# Patient Record
Sex: Female | Born: 1965 | Race: White | Hispanic: No | Marital: Single
Health system: Midwestern US, Community
[De-identification: ages and names within clinical notes are randomized; demographics above are authoritative.]

## PROBLEM LIST (undated history)

## (undated) DIAGNOSIS — M2550 Pain in unspecified joint: Secondary | ICD-10-CM

## (undated) DIAGNOSIS — F439 Reaction to severe stress, unspecified: Secondary | ICD-10-CM

## (undated) DIAGNOSIS — Z1231 Encounter for screening mammogram for malignant neoplasm of breast: Secondary | ICD-10-CM

---

## 2018-05-16 ENCOUNTER — Inpatient Hospital Stay: Admit: 2018-05-16 | Discharge: 2018-05-16 | Attending: Student in an Organized Health Care Education/Training Program

## 2018-05-16 NOTE — ED Notes (Signed)
Pt registered and left without being triaged.

## 2018-05-16 NOTE — ED Notes (Signed)
Pt registered and left without being triaged.

## 2018-06-06 ENCOUNTER — Ambulatory Visit: Attending: Internal Medicine | Primary: Internal Medicine

## 2018-06-06 ENCOUNTER — Inpatient Hospital Stay: Admit: 2018-06-07 | Payer: PRIVATE HEALTH INSURANCE | Primary: Internal Medicine

## 2018-06-06 ENCOUNTER — Inpatient Hospital Stay: Admit: 2018-06-06 | Payer: PRIVATE HEALTH INSURANCE | Attending: Internal Medicine | Primary: Internal Medicine

## 2018-06-06 ENCOUNTER — Ambulatory Visit
Admit: 2018-06-06 | Discharge: 2018-06-06 | Payer: PRIVATE HEALTH INSURANCE | Attending: Internal Medicine | Primary: Internal Medicine

## 2018-06-06 ENCOUNTER — Inpatient Hospital Stay: Admit: 2018-06-06 | Payer: PRIVATE HEALTH INSURANCE | Primary: Internal Medicine

## 2018-06-06 DIAGNOSIS — K769 Liver disease, unspecified: Secondary | ICD-10-CM

## 2018-06-06 DIAGNOSIS — M19041 Primary osteoarthritis, right hand: Secondary | ICD-10-CM

## 2018-06-06 DIAGNOSIS — F1190 Opioid use, unspecified, uncomplicated: Secondary | ICD-10-CM

## 2018-06-06 LAB — AMB POC URINALYSIS DIP STICK AUTO W/O MICRO
Bilirubin (UA POC): NEGATIVE
Bilirubin, Urine, POC: NEGATIVE
Blood (UA POC): NEGATIVE
Blood (UA POC): NEGATIVE
Glucose (UA POC): NEGATIVE
Glucose, Urine, POC: NEGATIVE
Ketones (UA POC): NEGATIVE
Ketones, Urine, POC: NEGATIVE
Leukocyte Esterase, Urine, POC: NEGATIVE
Leukocyte esterase (UA POC): NEGATIVE
Nitrite, Urine, POC: NEGATIVE
Nitrites (UA POC): NEGATIVE
Protein (UA POC): NEGATIVE
Protein, Urine, POC: NEGATIVE
Specific Gravity, Urine, POC: 1.01 NA (ref 1.001–1.035)
Specific gravity (UA POC): 1.01 (ref 1.001–1.035)
Urobilinogen (UA POC): 0.2 (ref 0.2–1)
Urobilinogen, POC: 0.2 (ref 0.2–1)
pH (UA POC): 7 (ref 4.6–8.0)
pH, Urine, POC: 7 NA (ref 4.6–8.0)

## 2018-06-06 LAB — COMPREHENSIVE METABOLIC PANEL
ALT: 28 U/L (ref 12–78)
AST: 13 U/L — ABNORMAL LOW (ref 15–37)
Albumin/Globulin Ratio: 1.3 (ref 1.1–2.2)
Albumin: 4 g/dL (ref 3.5–5.0)
Alkaline Phosphatase: 101 U/L (ref 45–117)
Anion Gap: 5 mmol/L (ref 5–15)
BUN: 13 MG/DL (ref 6–20)
Bun/Cre Ratio: 16 (ref 12–20)
CO2: 28 mmol/L (ref 21–32)
Calcium: 9 MG/DL (ref 8.5–10.1)
Chloride: 108 mmol/L (ref 97–108)
Creatinine: 0.81 MG/DL (ref 0.55–1.02)
EGFR IF NonAfrican American: >60 mL/min/{1.73_m2} (ref >60)
GFR African American: >60 mL/min/{1.73_m2} (ref >60)
Globulin: 3.1 g/dL (ref 2.0–4.0)
Glucose: 86 mg/dL (ref 65–100)
Potassium: 4.6 mmol/L (ref 3.5–5.1)
Sodium: 141 mmol/L (ref 136–145)
Total Bilirubin: 0.7 MG/DL (ref 0.2–1.0)
Total Protein: 7.1 g/dL (ref 6.4–8.2)

## 2018-06-06 LAB — CBC WITH AUTO DIFFERENTIAL
Basophils %: 0 % (ref 0–1)
Basophils Absolute: 0 10*3/uL (ref 0.0–0.1)
Eosinophils %: 1 % (ref 0–7)
Eosinophils Absolute: 0.1 10*3/uL (ref 0.0–0.4)
Granulocyte Absolute Count: 0 10*3/uL (ref 0.00–0.04)
Hematocrit: 44.6 % (ref 35.0–47.0)
Hemoglobin: 14.4 g/dL (ref 11.5–16.0)
Immature Granulocytes: 0 % (ref 0.0–0.5)
Lymphocytes %: 24 % (ref 12–49)
Lymphocytes Absolute: 2.4 10*3/uL (ref 0.8–3.5)
MCH: 31.2 PG (ref 26.0–34.0)
MCHC: 32.3 g/dL (ref 30.0–36.5)
MCV: 96.5 FL (ref 80.0–99.0)
MPV: 10.9 FL (ref 8.9–12.9)
Monocytes %: 6 % (ref 5–13)
Monocytes Absolute: 0.6 10*3/uL (ref 0.0–1.0)
NRBC Absolute: 0 10*3/uL (ref 0.00–0.01)
Neutrophils %: 69 % (ref 32–75)
Neutrophils Absolute: 7 10*3/uL (ref 1.8–8.0)
Nucleated RBCs: 0 PER 100 WBC
Platelets: 275 10*3/uL (ref 150–400)
RBC: 4.62 M/uL (ref 3.80–5.20)
RDW: 12.5 % (ref 11.5–14.5)
WBC: 10.3 10*3/uL (ref 3.6–11.0)

## 2018-06-06 LAB — LIPID PANEL
CHOL/HDL Ratio: 3.4 (ref 0.0–5.0)
Chol/HDL Ratio: 3.4 (ref 0.0–5.0)
Cholesterol, Total: 212 MG/DL — ABNORMAL HIGH (ref <200)
Cholesterol, total: 212 MG/DL — ABNORMAL HIGH (ref <200)
HDL Cholesterol: 62 MG/DL
HDL: 62 MG/DL
LDL Calculated: 113.4 MG/DL — ABNORMAL HIGH (ref 0–100)
LDL, calculated: 113.4 MG/DL — ABNORMAL HIGH (ref 0–100)
Triglyceride: 183 MG/DL — ABNORMAL HIGH (ref <150)
Triglycerides: 183 MG/DL — ABNORMAL HIGH (ref <150)
VLDL Cholesterol Calculated: 36.6 MG/DL
VLDL, calculated: 36.6 MG/DL

## 2018-06-06 LAB — RHEUMATOID FACTOR, QT
Rheumatoid Factor: <10 IU/mL (ref <15)
Rheumatoid factor: <10 IU/mL (ref <15)

## 2018-06-06 LAB — SEDIMENTATION RATE: Sed Rate: 9 mm/hr (ref 0–30)

## 2018-06-06 LAB — HEMOGLOBIN A1C W/EAG
Hemoglobin A1C: 5.6 % (ref 4.0–5.6)
eAG: 114 mg/dL

## 2018-06-06 LAB — CRP, HIGH SENSITIVITY
CRP High Sensitivity: 2.3 mg/L
CRP, High sensitivity: 2.3 mg/L

## 2018-06-06 LAB — T4, FREE
T4 Free: 0.9 NG/DL (ref 0.8–1.5)
T4, Free: 0.9 NG/DL (ref 0.8–1.5)

## 2018-06-06 LAB — TSH 3RD GENERATION
TSH: 0.72 u[IU]/mL (ref 0.36–3.74)
TSH: 0.72 u[IU]/mL (ref 0.36–3.74)

## 2018-06-06 LAB — SAMPLES BEING HELD

## 2018-06-06 LAB — CBC WITH AUTOMATED DIFF
ABS. BASOPHILS: 0 10*3/uL (ref 0.0–0.1)
ABS. EOSINOPHILS: 0.1 10*3/uL (ref 0.0–0.4)
ABS. IMM. GRANS.: 0 10*3/uL (ref 0.00–0.04)
ABS. LYMPHOCYTES: 2.4 10*3/uL (ref 0.8–3.5)
ABS. MONOCYTES: 0.6 10*3/uL (ref 0.0–1.0)
ABS. NEUTROPHILS: 7 10*3/uL (ref 1.8–8.0)
ABSOLUTE NRBC: 0 10*3/uL (ref 0.00–0.01)
BASOPHILS: 0 % (ref 0–1)
EOSINOPHILS: 1 % (ref 0–7)
HCT: 44.6 % (ref 35.0–47.0)
HGB: 14.4 g/dL (ref 11.5–16.0)
IMMATURE GRANULOCYTES: 0 % (ref 0.0–0.5)
LYMPHOCYTES: 24 % (ref 12–49)
MCH: 31.2 PG (ref 26.0–34.0)
MCHC: 32.3 g/dL (ref 30.0–36.5)
MCV: 96.5 FL (ref 80.0–99.0)
MONOCYTES: 6 % (ref 5–13)
MPV: 10.9 FL (ref 8.9–12.9)
NEUTROPHILS: 69 % (ref 32–75)
NRBC: 0 PER 100 WBC
PLATELET: 275 10*3/uL (ref 150–400)
RBC: 4.62 M/uL (ref 3.80–5.20)
RDW: 12.5 % (ref 11.5–14.5)
WBC: 10.3 10*3/uL (ref 3.6–11.0)

## 2018-06-06 LAB — METABOLIC PANEL, COMPREHENSIVE
A-G Ratio: 1.3 (ref 1.1–2.2)
ALT (SGPT): 28 U/L (ref 12–78)
AST (SGOT): 13 U/L — ABNORMAL LOW (ref 15–37)
Albumin: 4 g/dL (ref 3.5–5.0)
Alk. phosphatase: 101 U/L (ref 45–117)
Anion gap: 5 mmol/L (ref 5–15)
BUN/Creatinine ratio: 16 (ref 12–20)
BUN: 13 MG/DL (ref 6–20)
Bilirubin, total: 0.7 MG/DL (ref 0.2–1.0)
CO2: 28 mmol/L (ref 21–32)
Calcium: 9 MG/DL (ref 8.5–10.1)
Chloride: 108 mmol/L (ref 97–108)
Creatinine: 0.81 MG/DL (ref 0.55–1.02)
GFR est AA: >60 mL/min/{1.73_m2} (ref >60)
GFR est non-AA: >60 mL/min/{1.73_m2} (ref >60)
Globulin: 3.1 g/dL (ref 2.0–4.0)
Glucose: 86 mg/dL (ref 65–100)
Potassium: 4.6 mmol/L (ref 3.5–5.1)
Protein, total: 7.1 g/dL (ref 6.4–8.2)
Sodium: 141 mmol/L (ref 136–145)

## 2018-06-06 LAB — HEMOGLOBIN A1C WITH EAG
Est. average glucose: 114 mg/dL
Hemoglobin A1c: 5.6 % (ref 4.0–5.6)

## 2018-06-06 LAB — SED RATE (ESR): Sed rate, automated: 9 mm/hr (ref 0–30)

## 2018-06-06 MED ORDER — ESCITALOPRAM 20 MG TAB
20 mg | ORAL_TABLET | Freq: Every day | ORAL | 0 refills | Status: DC
Start: 2018-06-06 — End: 2018-08-27

## 2018-06-06 MED ORDER — HYDROCHLOROTHIAZIDE 12.5 MG CAP
12.5 mg | ORAL_CAPSULE | Freq: Every day | ORAL | 1 refills | Status: AC
Start: 2018-06-06 — End: 2018-09-04

## 2018-06-06 NOTE — Progress Notes (Signed)
Letter sent.  Osteoarthritis

## 2018-06-06 NOTE — Addendum Note (Signed)
Addended by: Su Grand on: 06/07/2018 08:40 AM     Modules accepted: Orders

## 2018-06-06 NOTE — Progress Notes (Signed)
Jessica Castaneda is a 52 y.o. female who presents for routine immunizations.   She denies any symptoms , reactions or allergies that would exclude them from being immunized today.  Risks and adverse reactions were discussed and the VIS was given to them. All questions were addressed.  There were no reactions observed.    [name], LPN

## 2018-06-06 NOTE — Patient Instructions (Signed)
Vaccine Information Statement     Tdap (Tetanus, Diphtheria, Pertussis) Vaccine: What You Need to Know    Many Vaccine Information Statements are available in Spanish and other languages. See www.immunize.org/vis.  Hojas de Informaci??n Sobre Vacunas est??n disponibles en espa??ol y en muchos otros idiomas. Visite http://www.immunize.org/vis    1. Why get vaccinated?    Tetanus, diphtheria, and pertussis are very serious diseases. Tdap vaccine can protect us from these diseases.  And, Tdap vaccine given to pregnant women can protect newborn babies against pertussis.    TETANUS (Lockjaw) is rare in the [place] today. It causes painful muscle tightening and stiffness, usually all over the body.  ? It can lead to tightening of muscles in the head and neck so you can???t open your mouth, swallow, or sometimes even breathe. Tetanus kills about 1 out of 10 people who are infected even after receiving the best medical care.      DIPHTHERIA is also rare in the [place] today.  It can cause a thick coating to form in the back of the throat.  ? It can lead to breathing problems, heart failure, paralysis, and death.    PERTUSSIS (Whooping Cough) causes severe coughing spells, which can cause difficulty breathing, vomiting, and disturbed sleep.  ? It can also lead to weight loss, incontinence, and rib fractures. Up to 2 in 100 adolescents and 5 in 100 adults with pertussis are hospitalized or have complications, which could include pneumonia or death.     These diseases are caused by bacteria. Diphtheria and pertussis are spread from person to person through secretions from coughing or sneezing. Tetanus enters the body through cuts, scratches, or wounds.    Before vaccines, as many as 200,000 cases of diphtheria, 200,000 cases of pertussis, and hundreds of cases of tetanus, were reported in the [place] each year. Since vaccination began, reports of cases for tetanus  and diphtheria have dropped by about 99% and for pertussis by about 80%.    2. Tdap vaccine    Tdap vaccine can protect adolescents and adults from tetanus, diphtheria, and pertussis. One dose of Tdap is routinely given at age 11 or 12.  People who did not get Tdap at that age should get it as soon as possible.    Tdap is especially important for health care professionals and anyone having close contact with a baby younger than 12 months.      Pregnant women should get a dose of Tdap during every pregnancy, to protect the newborn from pertussis.  Infants are most at risk for severe, life-threatening complications from pertussis.    Another vaccine, called Td, protects against tetanus and diphtheria, but not pertussis. A Td booster should be given every 10 years. Tdap may be given as one of these boosters if you have never gotten Tdap before.  Tdap may also be given after a severe cut or burn to prevent tetanus infection.    Your doctor or the person giving you the vaccine can give you more information.    Tdap may safely be given at the same time as other vaccines.    3. Some people should not get this vaccine    ??? A person who has ever had a life-threatening allergic reaction after a previous dose of any diphtheria, tetanus or pertussis containing vaccine, OR has a severe allergy to any part of this vaccine, should not get Tdap vaccine.  Tell the person giving the vaccine about any severe allergies.    ???   Anyone who had coma or long repeated seizures within 7 days after a childhood dose of DTP or DTaP, or a previous dose of Tdap, should not get Tdap, unless a cause other than the vaccine was found.  They can still get Td.    ??? Talk to your doctor if you:  - have seizures or another nervous system problem,  - had severe pain or swelling after any vaccine containing diphtheria, tetanus or pertussis,   - ever had a condition called Guillain Barr?? Syndrome (GBS),  - aren???t feeling well on the day the shot is scheduled.     4. Risks    With any medicine, including vaccines, there is a chance of side effects. These are usually mild and go away on their own. Serious reactions are also possible but are rare.     Most people who get Tdap vaccine do not have any problems with it.    Mild Problems following Tdap  (Did not interfere with activities)  ??? Pain where the shot was given (about 3 in 4 adolescents or 2 in 3 adults)  ??? Redness or swelling where the shot was given (about 1 person in 5)  ??? Mild fever of at least 100.4??F (up to about 1 in 25 adolescents or 1 in 100 adults)  ??? Headache (about 3 or 4 people in 10)  ??? Tiredness (about 1 person in 3 or 4)  ??? Nausea, vomiting, diarrhea, stomach ache (up to 1 in 4 adolescents or 1 in 10 adults)  ??? Chills,  sore joints (about 1 person in 10)  ??? Body aches (about 1 person in 3 or 4)   ??? Rash, swollen glands (uncommon)    Moderate Problems following Tdap  (Interfered with activities, but did not require medical attention)  ??? Pain where the shot was given (up to 1 in 5 or 6)   ??? Redness or swelling where the shot was given (up to about 1 in 16 adolescents or 1 in 12 adults)  ??? Fever over 102??F (about 1 in 100 adolescents or 1 in 250 adults)  ??? Headache (about 1 in 7 adolescents or 1 in 10 adults)  ??? Nausea, vomiting, diarrhea, stomach ache (up to 1 or 3 people in 100)  ??? Swelling of the entire arm where the shot was given (up to about 1 in 500).     Severe Problems following Tdap  (Unable to perform usual activities; required medical attention)  ??? Swelling, severe pain, bleeding, and redness in the arm where the shot was given (rare).    Problems that could happen after any vaccine:    ??? People sometimes faint after a medical procedure, including vaccination. Sitting or lying down for about 15 minutes can help prevent fainting, and injuries caused by a fall. Tell your doctor if you feel dizzy, or have vision changes or ringing in the ears.     ??? Some people get severe pain in the shoulder and have difficulty moving the arm where a shot was given. This happens very rarely.    ??? Any medication can cause a severe allergic reaction. Such reactions from a vaccine are very rare, estimated at fewer than 1 in a million doses, and would happen within a few minutes to a few hours after the vaccination.     As with any medicine, there is a very remote chance of a vaccine causing a serious injury or death.    The safety of vaccines is   always being monitored. For more information, visit: www.cdc.gov/vaccinesafety/    5. What if there is a serious problem?    What should I look for?  ??? Look for anything that concerns you, such as signs of a severe allergic reaction, very high fever, or unusual behavior.    ??? Signs of a severe allergic reaction can include hives, swelling of the face and throat, difficulty breathing, a fast heartbeat, dizziness, and weakness. These would usually start a few minutes to a few hours after the vaccination.    What should I do?  ??? If you think it is a severe allergic reaction or other emergency that can???t wait, call 9-1-1 or get the person to the nearest hospital. Otherwise, call your doctor.    ??? Afterward, the reaction should be reported to the Vaccine Adverse Event Reporting System (VAERS). Your doctor might file this report, or you can do it yourself through the VAERS web site at www.vaers.hhs.gov, or by calling 1-800-822-7967.    VAERS does not give medical advice.    6. The National Vaccine Injury Compensation Program    The National Vaccine Injury Compensation Program (VICP) is a federal program that was created to compensate people who may have been injured by certain vaccines.    Persons who believe they may have been injured by a vaccine can learn about the program and about filing a claim by calling 1-800-338-2382 or visiting the VICP website at www.hrsa.gov/vaccinecompensation. There is a  time limit to file a claim for compensation.     7. How can I learn more?    ??? Ask your doctor. He or she can give you the vaccine package insert or suggest other sources of information.  ??? Call your local or state health department.  ??? Contact the Centers for Disease Control and Prevention (CDC):  - Call 1-800-232-4636 (1-800-CDC-INFO) or  - Visit CDC???s website at www.cdc.gov/vaccines      Vaccine Information Statement   Tdap Vaccine  (07/08/2013)  42 [place] ?? 300aa-26    Department of Health and Human Services  Centers for Disease Control and Prevention    Office Use Only

## 2018-06-06 NOTE — Progress Notes (Addendum)
Assessment and Plan   Diagnoses and all orders for this visit:    1. Liver lesion  -     MRI ABD W CONT; Future  May also have an appointment with [facility]    2. Encounter for immunization  -     PR IMMUNIZ ADMIN,1 SINGLE/COMB VAC/TOXOID  -     TETANUS, DIPHTHERIA TOXOIDS AND ACELLULAR PERTUSSIS VACCINE (TDAP), IN INDIVIDS. >=7, IM    3. Stress  -     escitalopram oxalate (LEXAPRO) 20 mg tablet; Take 1 Tab by mouth daily for 90 days.  Refill provided    4. Urinary frequency  -     AMB POC URINALYSIS DIP STICK AUTO W/O MICRO  Unclear etiology.  Obtain history at another visit    5. Palpitations  -     AMB POC EKG ROUTINE W/ 12 LEADS, INTER & REP  -     CBC WITH AUTOMATED DIFF; Future  -     METABOLIC PANEL, COMPREHENSIVE; Future  -     TSH 3RD GENERATION; Future  -     T4, FREE; Future  -     T3 TOTAL; Future  EKG with normal sinus rhythm.  History is unclear    6. Chronic narcotic use  -     DRUG SCREEN, URINE; Future  PMP profile was accessed online for [place] and reviewed by me during this encounter.  I did not see evidence of inappropriate or suspicious controlled substance prescription activity.  Tramadol rx provided    7. Swelling of both hands  -     XR HAND LT MIN 3 V; Future  -     XR HAND RT MIN 3 V; Future  -     REFERRAL TO RHEUMATOLOGY  -     ANA BY IFA REFLEX  -     SED RATE (ESR); Future  -     CRP, HIGH SENSITIVITY; Future  -     RHEUMATOID FACTOR, QT; Future  -     CYCLIC CITRUL PEPTIDE AB, IGG; Future  Unclear etiology.  Consider rheumatology because    8. Abdominal pain, unspecified abdominal location  -     REFERRAL TO OBSTETRICS AND GYNECOLOGY  Does have a history of ovarian cysts.  CT scan earlier this month showed enteritis.  Unclear history of symptoms.  She is due for a Pap smear     9. Screening for breast cancer  -     MAM MAMMO BI SCREENING INCL CAD; Future     10. Obesity (BMI 30-39.9)  -     HEMOGLOBIN A1C WITH EAG; Future  -     LIPID PANEL; Future   Working on diet    Hypertension  Other orders  -     hydroCHLOROthiazide (MICROZIDE) 12.5 mg capsule; Take 1 Cap by mouth daily for 90 days.  Refill provided.  Has been out of medicines for the past few days    Return to clinic: 1 to 3 months to discuss symptoms.  Will need to focus on one problem at a time.  Discussed that multiple visits are likely to be needed to fully elucidate the etiology of her symptoms    Jessica Jansky, MD  [facility]  06/06/2018    Future Appointments   Date Time Provider [place]   06/06/2018 11:30 AM WTC XR 1 [facility] [place]   06/06/2018 11:55 AM LAB OUTREACH [place]  H        History of Present Illness   Chief Complaint   Establish care    Jessica Castaneda is a 53 y.o. female     Presents to clinic for several complaints.    Episodes of lightheadedness and feeling she is going to pass out associated with palpitations.  Started just before Christmas.  Has become less frequent.  Denies any chest pain, shortness of breath.    Bilateral hand numbness and painful sensation.  Worse in the morning and can flare during the day.  Ongoing for several months.  Also endorses left hip, right ankle, shoulder pain.  Says that she was tested for rheumatoid arthritis before and that test was negative.  Denies any oral ulcers, photosensitivity, dry mouth or dry eyes, difficulty swallowing.    Abdominal pain.  Was seen in the ED earlier this month.  CT with enteritis.  CT also noted a 10 mm liver lesion and recommended a follow-up MRI.    Elevated temperature.  Patient reports that recently she has been running 99 when she is normally 96-97.  Denies any recent illnesses    Hypertension???has been off of medications for 2 to 3 days that she is ran out.    Stress???on Lexapro.  Is a Adult nurse.  Wants to eventually get off of medications.    Tobacco use??? considering quitting.    Alcohol use??? drinks about 10 drinks a week.         Review of Systems   Constitutional: Negative for chills and fever.   HENT: Negative for hearing loss.    Eyes: Negative for blurred vision.   Respiratory: Negative for shortness of breath.    Cardiovascular: Negative for chest pain.   Gastrointestinal: Positive for abdominal pain. Negative for blood in stool, constipation, diarrhea, melena, nausea and vomiting.   Genitourinary: Negative for dysuria and hematuria.   Musculoskeletal: Negative for joint pain.   Skin: Negative for rash.   Neurological: Negative for headaches.        Past Medical History   No Known Allergies     Current Outpatient Medications   Medication Sig   ??? traMADol (ULTRAM) 50 mg tablet Take 50 mg by mouth every six (6) hours as needed for Pain.   ??? hydroCHLOROthiazide (MICROZIDE) 12.5 mg capsule Take 1 Cap by mouth daily for 90 days.   ??? escitalopram oxalate (LEXAPRO) 20 mg tablet Take 1 Tab by mouth daily for 90 days.     No current facility-administered medications for this visit.           Patient Active Problem List   Diagnosis Code   ??? Rotator cuff disorder, right M67.911   ??? Liver lesion K76.9   ??? Stress F43.9   ??? Palpitations R00.2   ??? Chronic narcotic use F11.90   ??? Swelling of both hands M79.89   ??? Abdominal pain, unspecified abdominal location R10.9   ??? Obesity (BMI 30-39.9) E66.9     Past Surgical History:   Procedure Laterality Date   ??? HX GYN      removed ovarian cyst   ??? HX TUBAL LIGATION      Uterine Ablation      Social History     Tobacco Use   ??? Smoking status: Current Every Day Smoker     Packs/day: 1.00   ??? Smokeless tobacco: Never Used   Substance Use Topics   ??? Alcohol use: Not on file  Comment: 2-3 beers or wine a day      Family History   Problem Relation Age of Onset   ??? Diabetes Father    ??? Heart Attack Father         multiple   ??? Stroke Father         multiple   ??? Celiac Disease Sister    ??? Other Sister         fibromyalgia        Physical Exam   Vitals:       Visit Vitals   BP (!) 139/91 (BP 1 Location: Left arm, BP Patient Position: Sitting)   Pulse 75   Temp 99.3 ??F (37.4 ??C) (Oral)   Resp 18   Ht 5' 5" (1.651 m)   Wt 184 lb 4 oz (83.6 kg)   SpO2 99%   BMI 30.66 kg/m??        Physical Exam  Constitutional:       General: She is not in acute distress.     Appearance: She is well-developed.      Comments: Occasionally tearful   HENT:      Right Ear: Tympanic membrane, ear canal and external ear normal.      Left Ear: Tympanic membrane, ear canal and external ear normal.      Nose: Nose normal.      Mouth/Throat:      Mouth: Mucous membranes are moist.      Pharynx: No posterior oropharyngeal erythema.   Eyes:      Conjunctiva/sclera: Conjunctivae normal.      Pupils: Pupils are equal, round, and reactive to light.   Neck:      Musculoskeletal: Neck supple.   Cardiovascular:      Rate and Rhythm: Normal rate and regular rhythm.      Pulses: Normal pulses.      Heart sounds: No murmur. No friction rub. No gallop.    Pulmonary:      Effort: No respiratory distress.      Breath sounds: No wheezing or rales.   Abdominal:      General: Bowel sounds are normal. There is no distension.      Palpations: Abdomen is soft. There is no hepatomegaly, splenomegaly or mass.      Tenderness: There is no tenderness.      Hernia: No hernia is present.   Musculoskeletal:      Comments: General swelling of bilateral hands especially MCP and PIP   Skin:     General: Skin is warm.      Findings: No rash.   Neurological:      Mental Status: She is alert.   Psychiatric:         Thought Content: Thought content normal.         Judgment: Judgment normal.          Voice recognition software is utilized and this note may contain transcription errors

## 2018-06-06 NOTE — Progress Notes (Signed)
Letter sent.  Rheumatoid arthritis labs normal.  Cholesterol mildly elevated.  No medications.  X-ray showing osteoarthritis

## 2018-06-06 NOTE — Progress Notes (Signed)
Letter sent.  Rheumatoid arthritis labs normal.  Cholesterol mildly elevated.  No medications.  X-ray showing osteoarthritis

## 2018-06-06 NOTE — Progress Notes (Signed)
Letter sent.  Osteoarthritis

## 2018-06-06 NOTE — Progress Notes (Signed)
Assessment and Plan   Diagnoses and all orders for this visit:    1. Liver lesion  -     MRI ABD W CONT; Future  May also have an appointment with [place] [facility]    2. Encounter for immunization  -     PR IMMUNIZ ADMIN,1 SINGLE/COMB VAC/TOXOID  -     TETANUS, DIPHTHERIA TOXOIDS AND ACELLULAR PERTUSSIS VACCINE (TDAP), IN INDIVIDS. >=7, IM    3. Stress  -     escitalopram oxalate (LEXAPRO) 20 mg tablet; Take 1 Tab by mouth daily for 90 days.  Refill provided    4. Urinary frequency  -     AMB POC URINALYSIS DIP STICK AUTO W/O MICRO  Unclear etiology.  Obtain history at another visit    5. Palpitations  -     AMB POC EKG ROUTINE W/ 12 LEADS, INTER & REP  -     CBC WITH AUTOMATED DIFF; Future  -     METABOLIC PANEL, COMPREHENSIVE; Future  -     TSH 3RD GENERATION; Future  -     T4, FREE; Future  -     T3 TOTAL; Future  EKG with normal sinus rhythm.  History is unclear    6. Chronic narcotic use  -     DRUG SCREEN, URINE; Future  PMP profile was accessed online for [place] and reviewed by me during this encounter.  I did not see evidence of inappropriate or suspicious controlled substance prescription activity.  Tramadol rx provided    7. Swelling of both hands  -     XR HAND LT MIN 3 V; Future  -     XR HAND RT MIN 3 V; Future  -     REFERRAL TO RHEUMATOLOGY  -     ANA BY IFA REFLEX  -     SED RATE (ESR); Future  -     CRP, HIGH SENSITIVITY; Future  -     RHEUMATOID FACTOR, QT; Future  -     CYCLIC CITRUL PEPTIDE AB, IGG; Future  Unclear etiology.  Consider rheumatology because    8. Abdominal pain, unspecified abdominal location  -     REFERRAL TO OBSTETRICS AND GYNECOLOGY  Does have a history of ovarian cysts.  CT scan earlier this month showed enteritis.  Unclear history of symptoms.  She is due for a Pap smear     9. Screening for breast cancer  -     MAM MAMMO BI SCREENING INCL CAD; Future     10. Obesity (BMI 30-39.9)  -     HEMOGLOBIN A1C WITH EAG; Future  -     LIPID PANEL; Future  Working on  diet    Hypertension  Other orders  -     hydroCHLOROthiazide (MICROZIDE) 12.5 mg capsule; Take 1 Cap by mouth daily for 90 days.  Refill provided.  Has been out of medicines for the past few days    Return to clinic: 1 to 3 months to discuss symptoms.  Will need to focus on one problem at a time.  Discussed that multiple visits are likely to be needed to fully elucidate the etiology of her symptoms    Jessica Jansky, MD  [facility]  06/06/2018    Future Appointments   Date Time Provider [place]   06/06/2018 11:30 AM WTC XR 1 [facility] [place]   06/06/2018 11:55 AM LAB OUTREACH [place]  H        History of Present Illness   Chief Complaint   Establish care    Jessica Castaneda is a 53 y.o. female     Presents to clinic for several complaints.    Episodes of lightheadedness and feeling she is going to pass out associated with palpitations.  Started just before Christmas.  Has become less frequent.  Denies any chest pain, shortness of breath.    Bilateral hand numbness and painful sensation.  Worse in the morning and can flare during the day.  Ongoing for several months.  Also endorses left hip, right ankle, shoulder pain.  Says that she was tested for rheumatoid arthritis before and that test was negative.  Denies any oral ulcers, photosensitivity, dry mouth or dry eyes, difficulty swallowing.    Abdominal pain.  Was seen in the ED earlier this month.  CT with enteritis.  CT also noted a 10 mm liver lesion and recommended a follow-up MRI.    Elevated temperature.  Patient reports that recently she has been running 99 when she is normally 96-97.  Denies any recent illnesses    Hypertension???has been off of medications for 2 to 3 days that she is ran out.    Stress???on Lexapro.  Is a Adult nurse.  Wants to eventually get off of medications.    Tobacco use??? considering quitting.    Alcohol use??? drinks about 10 drinks a week.        Review of  Systems   Constitutional: Negative for chills and fever.   HENT: Negative for hearing loss.    Eyes: Negative for blurred vision.   Respiratory: Negative for shortness of breath.    Cardiovascular: Negative for chest pain.   Gastrointestinal: Positive for abdominal pain. Negative for blood in stool, constipation, diarrhea, melena, nausea and vomiting.   Genitourinary: Negative for dysuria and hematuria.   Musculoskeletal: Negative for joint pain.   Skin: Negative for rash.   Neurological: Negative for headaches.        Past Medical History   No Known Allergies     Current Outpatient Medications   Medication Sig   ??? traMADol (ULTRAM) 50 mg tablet Take 50 mg by mouth every six (6) hours as needed for Pain.   ??? hydroCHLOROthiazide (MICROZIDE) 12.5 mg capsule Take 1 Cap by mouth daily for 90 days.   ??? escitalopram oxalate (LEXAPRO) 20 mg tablet Take 1 Tab by mouth daily for 90 days.     No current facility-administered medications for this visit.           Patient Active Problem List   Diagnosis Code   ??? Rotator cuff disorder, right M67.911   ??? Liver lesion K76.9   ??? Stress F43.9   ??? Palpitations R00.2   ??? Chronic narcotic use F11.90   ??? Swelling of both hands M79.89   ??? Abdominal pain, unspecified abdominal location R10.9   ??? Obesity (BMI 30-39.9) E66.9     Past Surgical History:   Procedure Laterality Date   ??? HX GYN      removed ovarian cyst   ??? HX TUBAL LIGATION      Uterine Ablation      Social History     Tobacco Use   ??? Smoking status: Current Every Day Smoker     Packs/day: 1.00   ??? Smokeless tobacco: Never Used   Substance Use Topics   ??? Alcohol use: Not on file  Comment: 2-3 beers or wine a day      Family History   Problem Relation Age of Onset   ??? Diabetes Father    ??? Heart Attack Father         multiple   ??? Stroke Father         multiple   ??? Celiac Disease Sister    ??? Other Sister         fibromyalgia        Physical Exam   Vitals:       Visit Vitals  BP (!) 139/91 (BP 1 Location: Left arm, BP Patient  Position: Sitting)   Pulse 75   Temp 99.3 ??F (37.4 ??C) (Oral)   Resp 18   Ht 5' 5"  (1.651 m)   Wt 184 lb 4 oz (83.6 kg)   SpO2 99%   BMI 30.66 kg/m??        Physical Exam  Constitutional:       General: She is not in acute distress.     Appearance: She is well-developed.      Comments: Occasionally tearful   HENT:      Right Ear: Tympanic membrane, ear canal and external ear normal.      Left Ear: Tympanic membrane, ear canal and external ear normal.      Nose: Nose normal.      Mouth/Throat:      Mouth: Mucous membranes are moist.      Pharynx: No posterior oropharyngeal erythema.   Eyes:      Conjunctiva/sclera: Conjunctivae normal.      Pupils: Pupils are equal, round, and reactive to light.   Neck:      Musculoskeletal: Neck supple.   Cardiovascular:      Rate and Rhythm: Normal rate and regular rhythm.      Pulses: Normal pulses.      Heart sounds: No murmur. No friction rub. No gallop.    Pulmonary:      Effort: No respiratory distress.      Breath sounds: No wheezing or rales.   Abdominal:      General: Bowel sounds are normal. There is no distension.      Palpations: Abdomen is soft. There is no hepatomegaly, splenomegaly or mass.      Tenderness: There is no tenderness.      Hernia: No hernia is present.   Musculoskeletal:      Comments: General swelling of bilateral hands especially MCP and PIP   Skin:     General: Skin is warm.      Findings: No rash.   Neurological:      Mental Status: She is alert.   Psychiatric:         Thought Content: Thought content normal.         Judgment: Judgment normal.          Voice recognition software is utilized and this note may contain transcription errors

## 2018-06-06 NOTE — Progress Notes (Signed)
Jessica Castaneda is a 53 y.o. female who presents for routine immunizations.   She denies any symptoms , reactions or allergies that would exclude them from being immunized today.  Risks and adverse reactions were discussed and the VIS was given to them. All questions were addressed.  There were no reactions observed.    Mariana Single, LPN

## 2018-06-06 NOTE — Addendum Note (Signed)
Addendum Note by Su Grand, MD at 06/06/18 0900                Author: Su Grand, MD  Service: --  Author Type: Physician       Filed: 06/07/18 0840  Encounter Date: 06/06/2018  Status: Signed          Editor: Su Grand, MD (Physician)          Addended by: Su Grand on: 06/07/2018 08:40 AM    Modules accepted: Orders

## 2018-06-07 LAB — DRUG SCREEN, URINE
AMPHETAMINES: NEGATIVE
Amphetamine Screen, Urine: NEGATIVE
BARBITURATES: NEGATIVE
BENZODIAZEPINES: NEGATIVE
Barbiturate Screen, Urine: NEGATIVE
Benzodiazepine Screen, Urine: NEGATIVE
COCAINE: NEGATIVE
Cocaine Screen Urine: NEGATIVE
METHADONE: NEGATIVE
Methadone Screen, Urine: NEGATIVE
OPIATES: NEGATIVE
Opiate Screen, Urine: NEGATIVE
PCP Screen, Urine: NEGATIVE
PCP(PHENCYCLIDINE): NEGATIVE
THC (TH-CANNABINOL): NEGATIVE
THC Screen, Urine: NEGATIVE

## 2018-06-07 LAB — T3: T3, TOTAL: 128 ng/dL (ref 71–180)

## 2018-06-07 LAB — T3 TOTAL: T3, total: 128 ng/dL (ref 71–180)

## 2018-06-07 MED ORDER — TRAMADOL 50 MG TAB
50 mg | ORAL_TABLET | Freq: Four times a day (QID) | ORAL | 0 refills | Status: DC | PRN
Start: 2018-06-07 — End: 2018-09-02

## 2018-06-08 LAB — CYCLIC CITRUL PEPTIDE AB, IGG
CCP ANTIBODIES IGG/IGA: 7 units (ref 0–19)
CCP Antibodies IgG/IgA: 7 units (ref 0–19)

## 2018-08-05 ENCOUNTER — Ambulatory Visit: Attending: Obstetrics & Gynecology | Primary: Internal Medicine

## 2018-08-05 ENCOUNTER — Encounter: Admit: 2018-08-05 | Discharge: 2018-08-05 | Payer: PRIVATE HEALTH INSURANCE | Primary: Internal Medicine

## 2018-08-05 ENCOUNTER — Encounter

## 2018-08-05 ENCOUNTER — Encounter: Admit: 2018-08-05 | Primary: Internal Medicine

## 2018-08-05 ENCOUNTER — Ambulatory Visit
Admit: 2018-08-05 | Discharge: 2018-08-05 | Payer: PRIVATE HEALTH INSURANCE | Attending: Obstetrics & Gynecology | Primary: Internal Medicine

## 2018-08-05 DIAGNOSIS — Z01419 Encounter for gynecological examination (general) (routine) without abnormal findings: Secondary | ICD-10-CM

## 2018-08-05 DIAGNOSIS — Z1231 Encounter for screening mammogram for malignant neoplasm of breast: Secondary | ICD-10-CM

## 2018-08-05 NOTE — Progress Notes (Signed)
Annual exam ages 57-64      Jessica Castaneda is a No obstetric history on file.,  53 y.o. female   No LMP recorded. Patient has had an ablation.    She presents for her annual checkup.     She is having significant occasional pelvic pain.      Menstrual status:    Her periods are absent due to ablation.    She reports no premenstrual symptoms.    Contraception:    The current method of family planning is tubal ligation.    Hormonal status:  She reports no perimenstrual type symptoms.   She is not having vasomotor symptoms.  The patient is not using any ERT.    Sexual history:    She  reports previously being sexually active.    Medical conditions:    Since her last annual GYN exam about five years ago, she has not the following changes in her health history: none.     Surgical history confirmed with patient.  has a past surgical history that includes hx tubal ligation; hx gyn; and pr lap,tubal cautery.    Pap and Mammogram History:    Her most recent Pap smear was normal, obtained a few year(s) ago.    The patient had her mammogram today in our office.    Breast Cancer History/Substance Abuse: negative      Osteoporosis History:    Family history does not include a first or second degree relative with osteopenia or osteoporosis.      Past Medical History:   Diagnosis Date   ??? Hypertension    ??? Rotator cuff disorder, right    ??? Skin cancer      Past Surgical History:   Procedure Laterality Date   ??? HX GYN      removed ovarian cyst   ??? HX TUBAL LIGATION      Uterine Ablation   ??? LAP,TUBAL CAUTERY         Current Outpatient Medications   Medication Sig Dispense Refill   ??? hydroCHLOROthiazide (MICROZIDE) 12.5 mg capsule Take 1 Cap by mouth daily for 90 days. 90 Cap 1   ??? escitalopram oxalate (LEXAPRO) 20 mg tablet Take 1 Tab by mouth daily for 90 days. 90 Tab 0     Allergies: Patient has no known allergies.     Tobacco History:  reports that she has been smoking. She has been smoking  about 1.00 pack per day. She has never used smokeless tobacco.  Alcohol Abuse:  reports current alcohol use.  Drug Abuse:  reports no history of drug use.    Family Medical/Cancer History:   Family History   Problem Relation Age of Onset   ??? Diabetes Father    ??? Heart Attack Father         multiple   ??? Stroke Father         multiple   ??? Celiac Disease Sister    ??? Other Sister         fibromyalgia        Review of Systems - History obtained from the patient  Constitutional: negative for weight loss, fever, night sweats  HEENT: negative for hearing loss, earache, congestion, snoring, sorethroat  CV: negative for chest pain, palpitations, edema  Resp: negative for cough, shortness of breath, wheezing  GI: negative for change in bowel habits, abdominal pain, black or bloody stools  GU: negative for frequency, dysuria, hematuria, vaginal discharge  MSK: negative for  back pain, joint pain, muscle pain  Breast: negative for breast lumps, nipple discharge, galactorrhea  Skin :negative for itching, rash, hives  Neuro: negative for dizziness, headache, confusion, weakness  Psych: negative for anxiety, depression, change in mood  Heme/lymph: negative for bleeding, bruising, pallor    Physical Exam    Visit Vitals  BP 122/86   Ht 5\' 5"  (1.651 m)   Wt 182 lb (82.6 kg)   BMI 30.29 kg/m??       Constitutional  ?? Appearance: well-nourished, well developed, alert, in no acute distress    HENT  ?? Head and Face: appears normal    Neck  ?? Inspection/Palpation: normal appearance, no masses or tenderness  ?? Lymph Nodes: no lymphadenopathy present  ?? Thyroid: gland size normal, nontender, no nodules or masses present on palpation    Chest  ?? Respiratory Effort: breathing unlabored    Breasts  ?? Inspection of Breasts: breasts symmetrical, no skin changes, no discharge present, nipple appearance normal, no skin retraction present  ?? Palpation of Breasts and Axillae: no masses present on palpation, no breast tenderness   ?? Axillary Lymph Nodes: no lymphadenopathy present    Gastrointestinal  ?? Abdominal Examination: abdomen non-tender to palpation, normal bowel sounds, no masses present  ?? Liver and spleen: no hepatomegaly present, spleen not palpable  ?? Hernias: no hernias identified    Genitourinary  ?? External Genitalia: normal appearance for age, no discharge present, no tenderness present, no inflammatory lesions present, no masses present, no atrophy present  ?? Vagina: normal vaginal vault without central or paravaginal defects, no discharge present, no inflammatory lesions present, no masses present  ?? Bladder: non-tender to palpation  ?? Urethra: appears normal  ?? Cervix: normal   ?? Uterus: normal size, shape and consistency  ?? Adnexa: no adnexal tenderness present, no adnexal masses present  ?? Perineum: perineum within normal limits, no evidence of trauma, no rashes or skin lesions present  ?? Anus: anus within normal limits, no hemorrhoids present  ?? Inguinal Lymph Nodes: no lymphadenopathy present    Skin  ?? General Inspection: no rash, no lesions identified    Neurologic/Psychiatric  ?? Mental Status:  ?? Orientation: grossly oriented to person, place and time  ?? Mood and Affect: mood normal, affect appropriate    Assessment:  Routine gynecologic examination  Her current medical status is satisfactory with no evidence of significant gynecologic issues.  Pelvic pain- will keep a pain log and if noncyclical and does not resolve then will rto for ultrasound.    Plan:  Counseled re: diet, exercise, healthy lifestyle  Return for yearly wellness visits  Rec annual mammogram

## 2018-08-05 NOTE — Patient Instructions (Signed)
Pap Test: Care Instructions  Your Care Instructions    The Pap test (also called a Pap smear) is a screening test for cancer of the cervix, which is the lower part of the uterus that opens into the vagina. The test can help your doctor find early changes in the cells that could lead to cancer.  The sample of cells taken during your test has been sent to a lab so that an expert can look at the cells. It usually takes a week or two to get the results back.  Follow-up care is a key part of your treatment and safety. Be sure to make and go to all appointments, and call your doctor if you are having problems. It's also a good idea to know your test results and keep a list of the medicines you take.  What do the results mean?  ?? A normal result means that the test did not find any abnormal cells in the sample.  ?? An abnormal result can mean many things. Most of these are not cancer. The results of your test may be abnormal because:  ? You have an infection of the vagina or cervix, such as a yeast infection.  ? You have an IUD (intrauterine device for birth control).  ? You have low estrogen levels after menopause that are causing the cells to change.  ? You have cell changes that may be a sign of precancer or cancer. The results are ranked based on how serious the changes might be.  There are many other reasons why you might not get a normal result. If the results were abnormal, you may need to get another test within a few weeks or months. If the results show changes that could be a sign of cancer, you may need a test called a colposcopy, which provides a more complete view of the cervix.  Sometimes the lab cannot use the sample because it does not contain enough cells or was not preserved well. If so, you may need to have the test again. This is not common, but it does happen from time to time.  When should you call for help?  Watch closely for changes in your health, and be sure to contact your doctor if:   ?? ?? You have vaginal bleeding or pain for more than 2 days after the test. It is normal to have a small amount of bleeding for a day or two after the test.   Where can you learn more?  Go to [employer]  Enter [phone] in the search box to learn more about "Pap Test: Care Instructions."  Current as of: August 21, 2019Content Version: 12.4  ?? 2006-2020 [employer].  Care instructions adapted under license by [employer] (which disclaims liability or warranty for this information). If you have questions about a medical condition or this instruction, always ask your healthcare professional. [employer] disclaims any warranty or liability for your use of this information.

## 2018-08-05 NOTE — Progress Notes (Signed)
Annual exam ages 42-64      Jessica Castaneda is a No obstetric history on file.,  53 y.o. female   No LMP recorded. Patient has had an ablation.    She presents for her annual checkup.     She is having significant occasional pelvic pain.      Menstrual status:    Her periods are absent due to ablation.    She reports no premenstrual symptoms.    Contraception:    The current method of family planning is tubal ligation.    Hormonal status:  She reports no perimenstrual type symptoms.   She is not having vasomotor symptoms.  The patient is not using any ERT.    Sexual history:    She  reports previously being sexually active.    Medical conditions:    Since her last annual GYN exam about five years ago, she has not the following changes in her health history: none.     Surgical history confirmed with patient.  has a past surgical history that includes hx tubal ligation; hx gyn; and pr lap,tubal cautery.    Pap and Mammogram History:    Her most recent Pap smear was normal, obtained a few year(s) ago.    The patient had her mammogram today in our office.    Breast Cancer History/Substance Abuse: negative      Osteoporosis History:    Family history does not include a first or second degree relative with osteopenia or osteoporosis.      Past Medical History:   Diagnosis Date   ??? Hypertension    ??? Rotator cuff disorder, right    ??? Skin cancer      Past Surgical History:   Procedure Laterality Date   ??? HX GYN      removed ovarian cyst   ??? HX TUBAL LIGATION      Uterine Ablation   ??? LAP,TUBAL CAUTERY         Current Outpatient Medications   Medication Sig Dispense Refill   ??? hydroCHLOROthiazide (MICROZIDE) 12.5 mg capsule Take 1 Cap by mouth daily for 90 days. 90 Cap 1   ??? escitalopram oxalate (LEXAPRO) 20 mg tablet Take 1 Tab by mouth daily for 90 days. 90 Tab 0     Allergies: Patient has no known allergies.     Tobacco History:  reports that she has been smoking. She has been smoking about 1.00 pack per day. She  has never used smokeless tobacco.  Alcohol Abuse:  reports current alcohol use.  Drug Abuse:  reports no history of drug use.    Family Medical/Cancer History:   Family History   Problem Relation Age of Onset   ??? Diabetes Father    ??? Heart Attack Father         multiple   ??? Stroke Father         multiple   ??? Celiac Disease Sister    ??? Other Sister         fibromyalgia        Review of Systems - History obtained from the patient  Constitutional: negative for weight loss, fever, night sweats  HEENT: negative for hearing loss, earache, congestion, snoring, sorethroat  CV: negative for chest pain, palpitations, edema  Resp: negative for cough, shortness of breath, wheezing  GI: negative for change in bowel habits, abdominal pain, black or bloody stools  GU: negative for frequency, dysuria, hematuria, vaginal discharge  MSK: negative for  back pain, joint pain, muscle pain  Breast: negative for breast lumps, nipple discharge, galactorrhea  Skin :negative for itching, rash, hives  Neuro: negative for dizziness, headache, confusion, weakness  Psych: negative for anxiety, depression, change in mood  Heme/lymph: negative for bleeding, bruising, pallor    Physical Exam    Visit Vitals  BP 122/86   Ht 5\' 5"  (1.651 m)   Wt 182 lb (82.6 kg)   BMI 30.29 kg/m??       Constitutional  ?? Appearance: well-nourished, well developed, alert, in no acute distress    HENT  ?? Head and Face: appears normal    Neck  ?? Inspection/Palpation: normal appearance, no masses or tenderness  ?? Lymph Nodes: no lymphadenopathy present  ?? Thyroid: gland size normal, nontender, no nodules or masses present on palpation    Chest  ?? Respiratory Effort: breathing unlabored    Breasts  ?? Inspection of Breasts: breasts symmetrical, no skin changes, no discharge present, nipple appearance normal, no skin retraction present  ?? Palpation of Breasts and Axillae: no masses present on palpation, no breast tenderness  ?? Axillary Lymph Nodes: no lymphadenopathy  present    Gastrointestinal  ?? Abdominal Examination: abdomen non-tender to palpation, normal bowel sounds, no masses present  ?? Liver and spleen: no hepatomegaly present, spleen not palpable  ?? Hernias: no hernias identified    Genitourinary  ?? External Genitalia: normal appearance for age, no discharge present, no tenderness present, no inflammatory lesions present, no masses present, no atrophy present  ?? Vagina: normal vaginal vault without central or paravaginal defects, no discharge present, no inflammatory lesions present, no masses present  ?? Bladder: non-tender to palpation  ?? Urethra: appears normal  ?? Cervix: normal   ?? Uterus: normal size, shape and consistency  ?? Adnexa: no adnexal tenderness present, no adnexal masses present  ?? Perineum: perineum within normal limits, no evidence of trauma, no rashes or skin lesions present  ?? Anus: anus within normal limits, no hemorrhoids present  ?? Inguinal Lymph Nodes: no lymphadenopathy present    Skin  ?? General Inspection: no rash, no lesions identified    Neurologic/Psychiatric  ?? Mental Status:  ?? Orientation: grossly oriented to person, place and time  ?? Mood and Affect: mood normal, affect appropriate    Assessment:  Routine gynecologic examination  Her current medical status is satisfactory with no evidence of significant gynecologic issues.  Pelvic pain- will keep a pain log and if noncyclical and does not resolve then will rto for ultrasound.    Plan:  Counseled re: diet, exercise, healthy lifestyle  Return for yearly wellness visits  Rec annual mammogram

## 2018-08-15 LAB — PAP IG, HPV AND RFX HPV 16/18,45(507815)
.: 0
HPV DNA Probe, High Risk: NEGATIVE
HPV, High Risk: NEGATIVE
LABCORP 019018: 0

## 2018-08-27 ENCOUNTER — Encounter

## 2018-08-27 MED ORDER — ESCITALOPRAM 20 MG TAB
20 mg | ORAL_TABLET | ORAL | 1 refills | Status: AC
Start: 2018-08-27 — End: ?

## 2018-08-31 ENCOUNTER — Encounter

## 2018-09-02 MED ORDER — TRAMADOL 50 MG TAB
50 mg | ORAL_TABLET | ORAL | 0 refills | Status: AC
Start: 2018-09-02 — End: 2018-10-02

## 2018-09-04 ENCOUNTER — Telehealth: Attending: Internal Medicine | Primary: Internal Medicine

## 2018-09-04 ENCOUNTER — Telehealth: Admit: 2018-09-04 | Payer: PRIVATE HEALTH INSURANCE | Attending: Internal Medicine | Primary: Internal Medicine

## 2018-09-04 DIAGNOSIS — L309 Dermatitis, unspecified: Secondary | ICD-10-CM

## 2018-09-04 MED ORDER — CLOBETASOL 0.05 % TOPICAL CREAM
0.05 % | Freq: Two times a day (BID) | CUTANEOUS | 1 refills | Status: AC | PRN
Start: 2018-09-04 — End: ?

## 2018-09-04 NOTE — Progress Notes (Signed)
Consent: Jessica Castaneda, who was seen by synchronous (real-time) audio-video technology, and/or her healthcare decision maker, is aware that this patient-initiated, Telehealth encounter on 09/04/2018 is a billable service, with coverage as determined by her insurance carrier. She is aware that she may receive a bill and has provided verbal consent to proceed: Yes.    I was in the office while conducting this encounter.    This visit was done with doxy[employer]me    Assessment and Plan   Diagnoses and all orders for this visit:    1. Dermatitis    Other orders  -     clobetasoL (TEMOVATE) 0.05 % topical cream; Apply  to affected area two (2) times daily as needed for Skin Irritation.  Difficult to see due to poor connection.  Possible poison ivy vs contact dermatitis.  Will do steroid cream.  If no improvement, consider systemic steroids.  Invitation sent to [facility] so she can send pictures.      We discussed the expected course, resolution and complications of the diagnosis(es) in detail.  Medication risks, benefits, costs, interactions, and alternatives were discussed as indicated.  I advised her to contact the office if her condition worsens, changes or fails to improve as anticipated. She expressed understanding with the diagnosis(es) and plan.     Return to clinic:   After the summer to follow up on issues discssed last visit    Jessica Grand, MD  [facility]  09/04/2018    No future appointments.     Subjective   Chief Complaint   rash    Jessica Castaneda is a 53 y.o. female     Reports pruritic rash on her arms and legs for the past week.  Difficult to see over video visit, but appeared as erythematous macules/patches.  Pt reports some vesicular lesions.  So itchy that she can't sleep.  Has had previous poison ivy rashes and wonders if this is poison ivy.  Has tried benadryl and calamine lotion without relief.  Denies fevers, chills, mouth  ulcers.  Reports that she was out in he woods a week ago.    Review of Systems   Constitutional: Negative for chills and fever.   Respiratory: Negative for shortness of breath.    Cardiovascular: Negative for chest pain.            Objective   Vitals:       Visit Vitals  Ht 5\' 5"  (1.651 m)   BMI 30.29 kg/m??        Physical Exam  Constitutional:       Appearance: Normal appearance. She is not ill-appearing.   Pulmonary:      Effort: No respiratory distress.   Skin:     Comments: Poor connection, very pixelated video; I could see some erythematous macules and patches on her thighs and some macules on her arms   Neurological:      Mental Status: She is alert.   Psychiatric:         Mood and Affect: Mood normal.         Thought Content: Thought content normal.         Judgment: Judgment normal.          Voice recognition software is utilized and this note may contain transcription errors     Jessica Castaneda is a 53 y.o. female being evaluated by a video visit encounter for concerns as above.  A caregiver was present when  appropriate. 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  Pursuant to the emergency declaration under the [facility] Act and the [employer], 1135 waiver authority and the [employer] Act, this Virtual  Visit was conducted, with patient's (and/or legal guardian's) consent, to reduce the patient's risk of exposure to COVID-19 and provide necessary medical care.     Services were provided through a video synchronous discussion virtually to substitute for in-person clinic visit.

## 2018-09-04 NOTE — Progress Notes (Signed)
Consent: Jessica Castaneda, who was seen by synchronous (real-time) audio-video technology, and/or her healthcare decision maker, is aware that this patient-initiated, Telehealth encounter on 09/04/2018 is a billable service, with coverage as determined by her insurance carrier. She is aware that she may receive a bill and has provided verbal consent to proceed: Yes.    I was in the office while conducting this encounter.    This visit was done with doxy.me    Assessment and Plan   Diagnoses and all orders for this visit:    1. Dermatitis    Other orders  -     clobetasoL (TEMOVATE) 0.05 % topical cream; Apply  to affected area two (2) times daily as needed for Skin Irritation.  Difficult to see due to poor connection.  Possible poison ivy vs contact dermatitis.  Will do steroid cream.  If no improvement, consider systemic steroids.  Invitation sent to [facility] so she can send pictures.      We discussed the expected course, resolution and complications of the diagnosis(es) in detail.  Medication risks, benefits, costs, interactions, and alternatives were discussed as indicated.  I advised her to contact the office if her condition worsens, changes or fails to improve as anticipated. She expressed understanding with the diagnosis(es) and plan.     Return to clinic:   After the summer to follow up on issues discssed last visit    Su Grand, MD  [facility]  09/04/2018    No future appointments.     Subjective   Chief Complaint   rash    Jessica Castaneda is a 53 y.o. female     Reports pruritic rash on her arms and legs for the past week.  Difficult to see over video visit, but appeared as erythematous macules/patches.  Pt reports some vesicular lesions.  So itchy that she can't sleep.  Has had previous poison ivy rashes and wonders if this is poison ivy.  Has tried benadryl and calamine lotion without relief.  Denies fevers, chills, mouth ulcers.  Reports that she was  out in he woods a week ago.    Review of Systems   Constitutional: Negative for chills and fever.   Respiratory: Negative for shortness of breath.    Cardiovascular: Negative for chest pain.            Objective   Vitals:       Visit Vitals  Ht 5\' 5"  (1.651 m)   BMI 30.29 kg/m??        Physical Exam  Constitutional:       Appearance: Normal appearance. She is not ill-appearing.   Pulmonary:      Effort: No respiratory distress.   Skin:     Comments: Poor connection, very pixelated video; I could see some erythematous macules and patches on her thighs and some macules on her arms   Neurological:      Mental Status: She is alert.   Psychiatric:         Mood and Affect: Mood normal.         Thought Content: Thought content normal.         Judgment: Judgment normal.          Voice recognition software is utilized and this note may contain transcription errors     Jessica Castaneda is a 53 y.o. female being evaluated by a video visit encounter for concerns as above.  A caregiver was present when  appropriate. 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  Pursuant to the emergency declaration under the [facility] Act and the [employer], 1135 waiver authority and the [employer] Act, this Virtual  Visit was conducted, with patient's (and/or legal guardian's) consent, to reduce the patient's risk of exposure to COVID-19 and provide necessary medical care.     Services were provided through a video synchronous discussion virtually to substitute for in-person clinic visit.

## 2018-09-05 NOTE — Telephone Encounter (Signed)
-----   Message from Lonn Georgia sent at 09/05/2018  8:10 AM EDT -----  Regarding: Dr. Frazier Richards  General Message/Vendor Calls    Caller's first and last name:Ashauna Rubye Oaks      Reason for call:spots on her body      Callback required yes/no and why:yes      Best contact number(s):[phone]      Details to clarify the request:Patient has some spots on her body and needs to see you please call.      Lonn Georgia

## 2018-09-05 NOTE — Telephone Encounter (Signed)
-----   Message from Doroteo Glassman sent at 09/05/2018 11:15 AM EDT -----  Regarding: Dr.Simpliciano/Telephone  General Message/Vendor Calls    Caller's first and last name:      Reason for call:  Follow up from virtual appointment     Callback required yes/no and why:  Yes, to let her know what her diagnosis is.     Best contact number(s):  [phone]    Details to clarify the request:      Doroteo Glassman

## 2018-09-05 NOTE — Telephone Encounter (Signed)
Skin problem  Returned patients call regarding her rash. Advised to  give the cream a few days. if still no improvement, call us back. there is an on call doctor over the weekend that she can call if she can't wait until Monday. Patient agreed to plan.

## 2018-09-05 NOTE — Telephone Encounter (Signed)
Called patient left a message. Can make an appointment to be seen

## 2018-09-17 NOTE — Telephone Encounter (Signed)
Call received ata 11:57 am    53 year old patient last seen in the office on 08/05/2018.    Patient calling about her recent pap smear results.      This nurse advised patient of MD reviewed labs and recommendations.    Patient advised she can view her labs through her my chart.    Patient verbalized understanding.      sult Notes for PAP IG, HPV AND RFX HPV [phone])     Notes recorded by Woodfin Ganja, MD on 08/15/2018 at 2:58 PM EDT  Results normal, add to facesheet, no ecc, repeat in 1 year

## 2018-10-19 NOTE — ED Notes (Signed)
Formatting of this note might be different from the original.  Ria Comment, Family member info [phone]    Sherilyn Cooter, RN  10/19/18 1003    Electronically signed by Sherilyn Cooter, RN at 10/19/2018 10:03 AM EDT

## 2018-10-19 NOTE — ED Provider Notes (Signed)
Formatting of this note is different from the original.  EMERGENCY DEPARTMENT ENCOUNTER      Please note that this clinical encounter occurred during the global COVID-19 pandemic.  All patients with symptoms are considered at risk for infection with SARS-COVID -19.  Clinical decision-making and criteria for admission, discharge and certain testing has necessarily changed given the strain on the healthcare system, including this facility.  All institutional protocols involving isolation, personal protective equipment, and standard supportive treatment measures (per CDC, there is no known effective antimicrobial treatment at this time) were followed.    CHIEF COMPLAINT    Chief Complaint   Patient presents with   ? Leg Pain     reports sudden onset of pain in R hip last night, radiating down leg to ankle; denies injury; recently Tx for [facility] Spotted Fever     HPI    Jessica Castaneda is a 53 y.o. female who presents with right hip pain that began suddenly last night.  Patient states she was just walking when she suddenly felt pain in the anterior and lateral right hip that radiates down the lateral aspect of the leg to the ankle.  She denies any history of similar problems.  Denies recent injury.  Denies any extremity weakness, paresthesias, bowel or bladder incontinence.  States she recently was treated for [facility] spotted fever with doxycycline for 1 month.    PAST MEDICAL HISTORY    Past Medical History:   Diagnosis Date   ? Hypertension    ? [place] spotted fever      SURGICAL HISTORY    History reviewed. No pertinent surgical history.    CURRENT MEDICATIONS    Current Facility-Administered Medications   Medication Dose Route Frequency Provider Last Rate Last Dose   ? ketorolac (TORADOL) injection 15 mg  15 mg Intramuscular Once Ward Fredric Mare, PA-C       ? predniSONE (DELTASONE) tablet 60 mg  60 mg Oral Once Ward Fredric Mare, PA-C         Current Outpatient Medications   Medication Sig Dispense Refill    ? hydroCHLOROthiazide (ORETIC) 12.5 MG tablet Take 12.5 mg by mouth daily.       ALLERGIES    No Known Allergies    FAMILY HISTORY    History reviewed. No pertinent family history.    SOCIAL HISTORY    Social History     Socioeconomic History   ? Marital status: None     Spouse name: None   ? Number of children: None   ? Years of education: None   ? Highest education level: None   Occupational History   ? None   Social Needs   ? Financial resource strain: None   ? Food insecurity     Worry: None     Inability: None   ? Transportation needs     Medical: None     Non-medical: None   Tobacco Use   ? Smoking status: Current Every Day Smoker     Packs/day: 1.00     Types: Cigarettes   ? Smokeless tobacco: Never Used   Substance and Sexual Activity   ? Alcohol use: None   ? Drug use: Not Currently   ? Sexual activity: None   Lifestyle   ? Physical activity     Days per week: None     Minutes per session: None   ? Stress: None   Relationships   ? Social connections  Talks on phone: None     Gets together: None     Attends religious service: None     Active member of club or organization: None     Attends meetings of clubs or organizations: None     Relationship status: None   ? Intimate partner violence     Fear of current or ex partner: None     Emotionally abused: None     Physically abused: None     Forced sexual activity: None   Other Topics Concern   ? None   Social History Narrative   ? None     REVIEW OF SYSTEMS    Constitutional:  Denies fever, chills, weight loss or weakness.    Respiratory:  Denies shortness of breath or cough.    Cardiovascular:  Denies chest pain, palpitations or swelling.    Skin:  Denies rash.    Neurologic:  Denies headache, focal weakness or sensory changes.    Endocrine:  Denies polyuria or polydipsia.      PHYSICAL EXAM    VITAL SIGNS: BP 124/85 (BP Location: Left upper arm, Patient Position: Sitting)   Pulse 80   Temp 99.5 F (37.5 C) (Oral)   Resp 16   Ht 5\' 5"  (1.651 m)   Wt  170 lb (77.1 kg)   SpO2 98%   BMI 28.29 kg/m    Constitutional:  Well developed, well nourished.  No acute distress.  HEENT:  Head atraumatic. Pupils equal round reactive to light.  Respiratory:  No respiratory distress.  Clear to auscultation bilaterally. No wheezes, rales or rhonchi.  Cardiovascular:  Regular rate and rhythm. No murmurs, rubs or gallops.    GI:  Normal bowel sounds. Abdomen is soft and nontender. No hepatosplenomegaly or masses.  Extremities:  Unremarkable inspection of the right hip.  There is no palpable tenderness.  There is elicited tenderness with movement.  Back: Inspection unremarkable; no swelling or ecchymosis.  Nontender.  Positive right straight leg raise  Skin:  Warm, dry, no erythema, no rash.  Neurologic:  Alert and oriented x 3. Cranial nerves II-XII grossly intact. Lower extremity DTRs 2+ bilaterally. Strength 5/5. Sensation grossly intact.    RADIOLOGY    X-ray Lumbar Spine Ap And Lat    Result Date: 10/19/2018  THREE VIEW LUMBAR SPINE: COMPARISON: None. FINDINGS: No acute fractures or destructive osseous lesions are present. No subluxations are present. No disc space narrowing is present.     IMPRESSION: NORMAL LUMBAR SPINE. Electronically Signed By: Jola Schmidt, MD 10/19/2018 11:35    X-ray Femur Right Ap And Lateral    Result Date: 10/19/2018  XR FEMUR RIGHT AP AND LATERAL - : COMPARISON: Concurrent pelvic radiograph FINDINGS: No acute fracture or dislocation. Mild degenerative changes at the right femoral acetabular joint. Calcifications adjacent to the right greater trochanter. The hip and knee are approximated. No suprapatellar effusion.     IMPRESSION: NO ACUTE ABNORMALITY. PROBABLE CALCIFIC BURSITIS OR TENDINITIS IN THE RIGHT HIP. Electronically Signed By: Angela Nevin, MD 10/19/2018 12:13    X-ray Pelvis 1 View    Result Date: 10/19/2018  SINGLE VIEW PELVIS: COMPARISON: No comparison. FINDINGS: No acute fractures or destructive osseous lesions are present. Nonaggressive  lytic lesion with a cortical margin is present in the right femoral neck.  Similar lesion in the left femoral neck.  These may represent small lipomas. Both hips appear unremarkable.  No joint space narrowing or osteophytes are present.  Soft tissue calcifications adjacent to  right greater trochanter. Sacroiliac joints are normal.     IMPRESSION: CALCIFIC BURSITIS OR CALCIFIC TENDINITIS OF THE RIGHT HIP.  NO DJD. Electronically Signed By: Jola Schmidt, MD 10/19/2018 11:35    LABORATORY  Results for orders placed or performed during the hospital encounter of 10/19/18   Urinalysis, Reflex with Microscopic   Result Value Ref Range    Color, Ur Yellow Colorless,Light Yellow,Yellow    Clarity, Ur Clear Clear    Glucose, Ur Negative Negative mg/dL    Bilirubin, Ur Negative Negative    Ketones, Ur Negative Negative mg/dL    Urine Specific Gravity 1.016 1.001 - 1.035    Blood, UA Negative Negative    pH, Ur 5.0 5.0 - 9.0    Protein, Ur Negative Negative mg/dL    Urine Urobilinogen <1.6 <2.0,1.0,0.2 mg/dL    Nitrite, Ur Negative Negative    Leukocytes, Urine Negative Negative   Human Chorionic Gonadotropin(HCg) Beta, Urine, Qualitative   Result Value Ref Range    Pregnancy Urine Negative Negative     ED COURSE & MEDICAL DECISION MAKING    Likely sciatica.  Patient denies a history of similar pains.  Requesting imaging.  X-rays ordered, urinalysis, pregnancy.  Will treat with IM Toradol, oral prednisone.  Plan to prescribe a prednisone taper.    Calcific tendinitis or calcific bursitis noted on x-rays.  Negative urinalysis and pregnancy.    FINAL IMPRESSION    Sciatica  Calcific bursitis, right hip    Ward Fredric Mare, PA-C  10/19/18 1223    Electronically signed by Gretchen Short, MD at 10/19/2018  1:51 PM EDT

## 2019-11-12 ENCOUNTER — Encounter: Attending: Specialist | Primary: Internal Medicine

## 2020-07-19 IMAGING — MR MRI RIGHT KNEE WITHOUT CONTRAST
4 of 5 series · 20 of 40 positions shown · IV contrast (gadolinium)
Comparison: None

MENANTI, CHO FAI 
MRI RIGHT KNEE WITHOUT CONTRAST, 07/19/2020 [DATE]: 
CLINICAL INDICATION: Right knee pain. Twisting injury in March 2020.
TECHNIQUE: Multiplanar, multiecho position MR images of the knee were performed 
without intravenous gadolinium enhancement.

[Series 101: survey_fullfov_transversal · axial · 10.0mm · 1.04mm/px · z∈[-51,+51]mm · 2 of 7 slices shown]
[im 1/7]
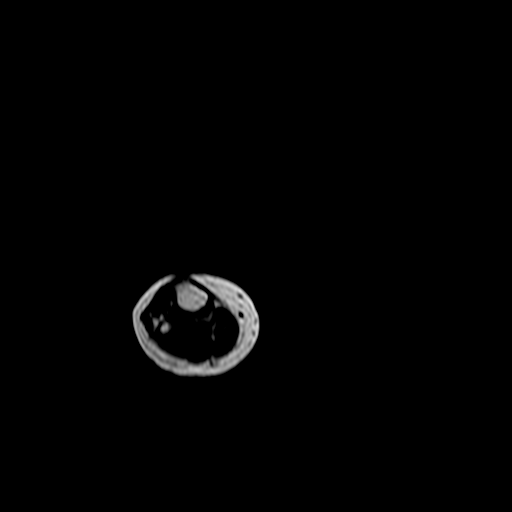
[im 7/7]
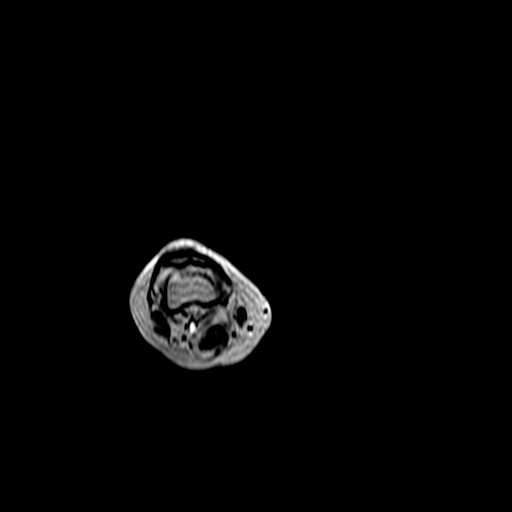

[Series 201: survey_ · axial · 10.0mm · 1.17mm/px · z∈[-20,+149]mm · 3 of 9 slices shown]
[im 1/9]
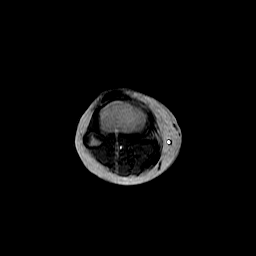
[im 5/9]
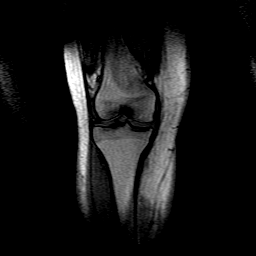
[im 9/9]
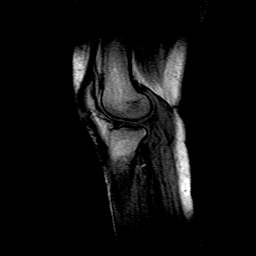

[Series 301: pdw spair ax · axial · 3.5mm · 0.30mm/px · z∈[-58,+62]mm · 4 of 34 slices shown]
[im 1/34]
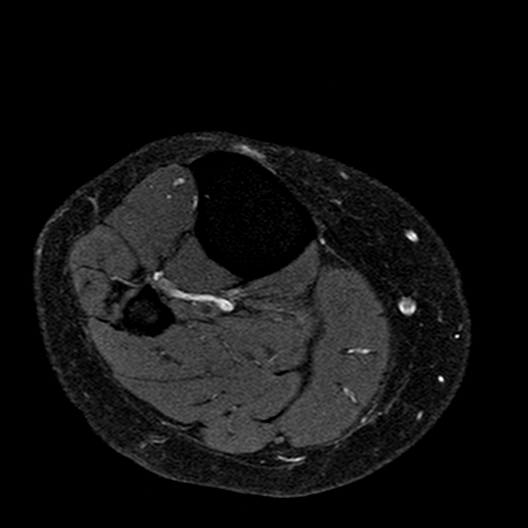
[im 7/34]
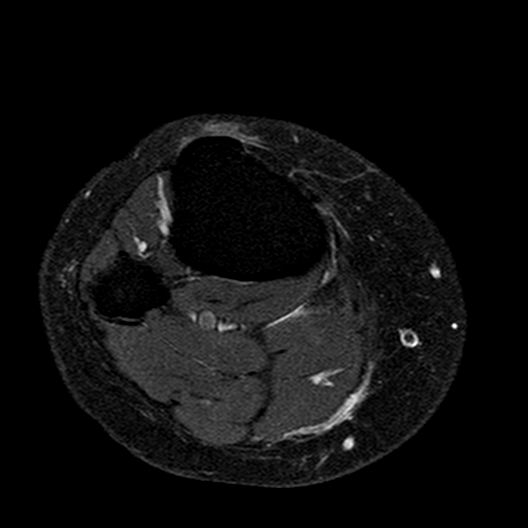
[im 19/34]
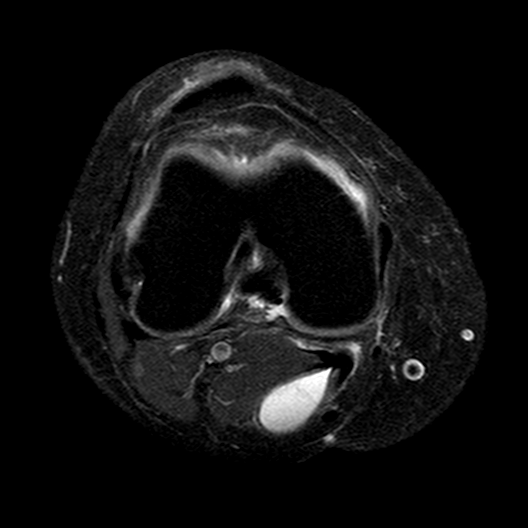
[im 31/34]
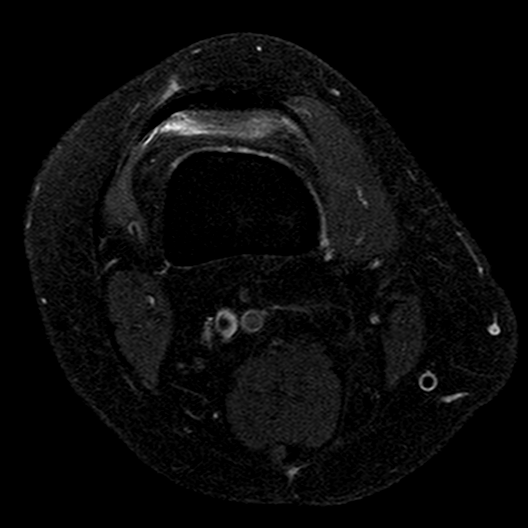

[Series 501: T1 · coronal · 3.0mm · 0.31mm/px · 11 of 33 slices shown]
[im 1/33]
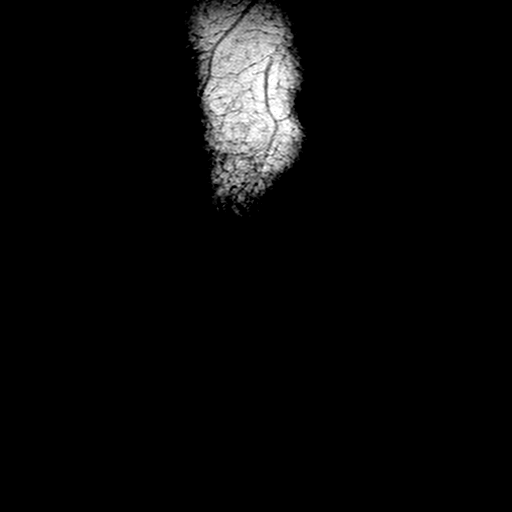
[im 4/33]
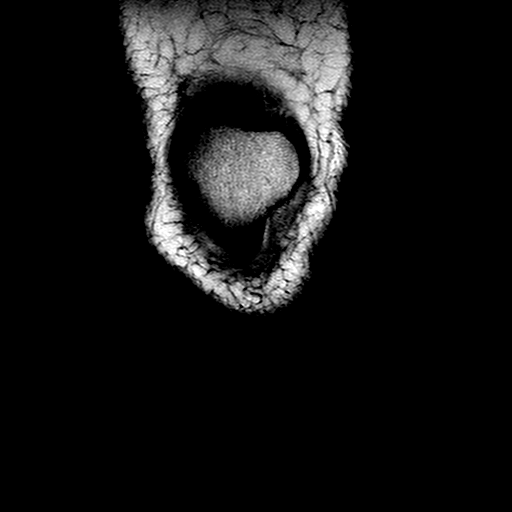
[im 7/33]
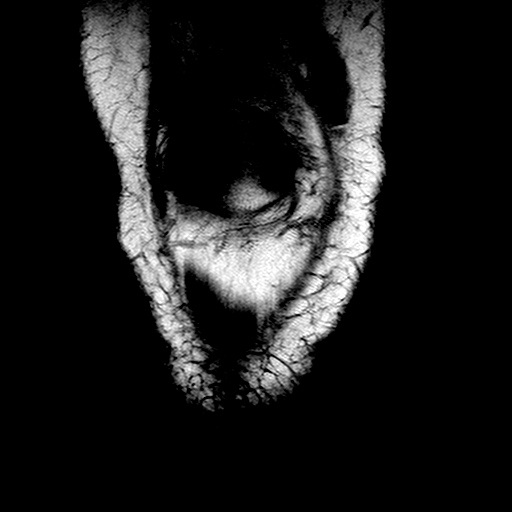
[im 10/33]
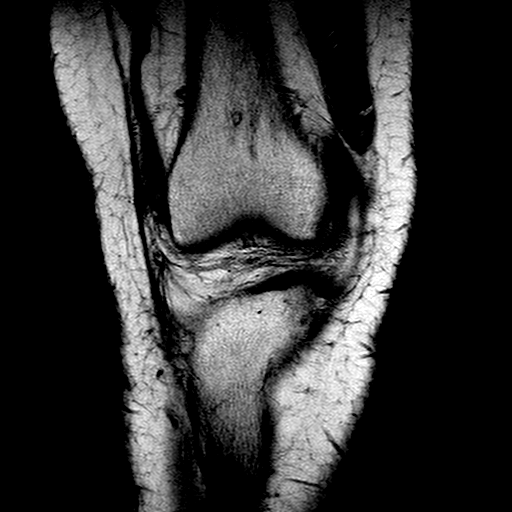
[im 13/33]
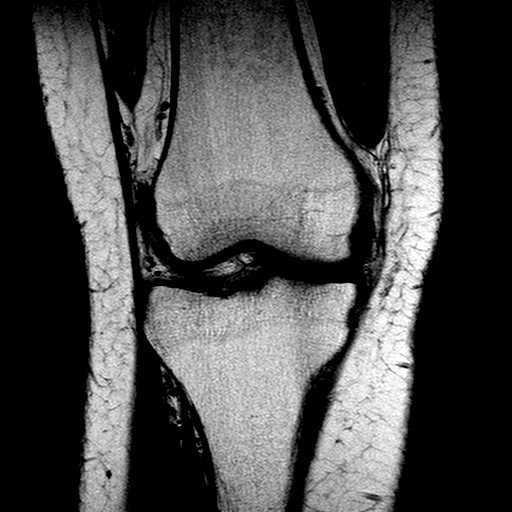
[im 17/33]
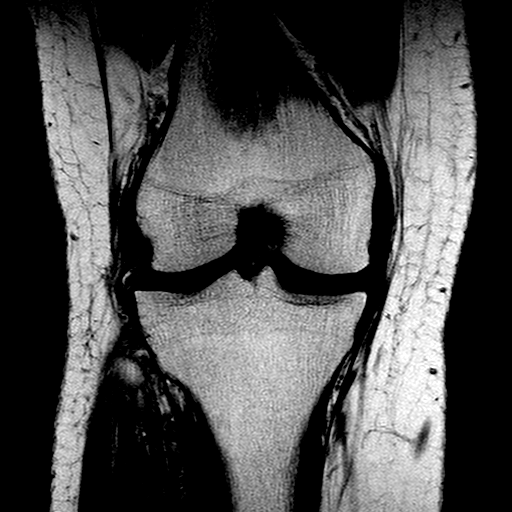
[im 20/33]
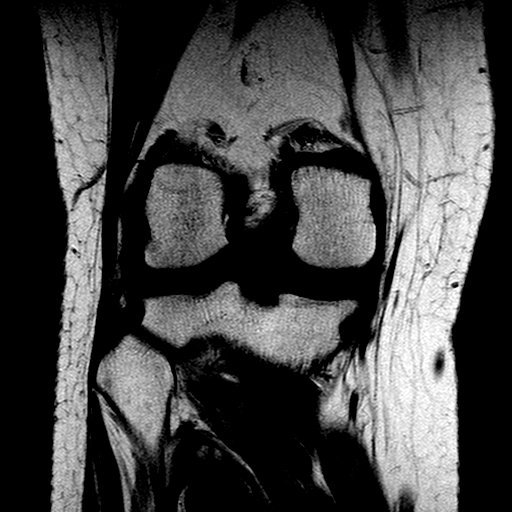
[im 23/33]
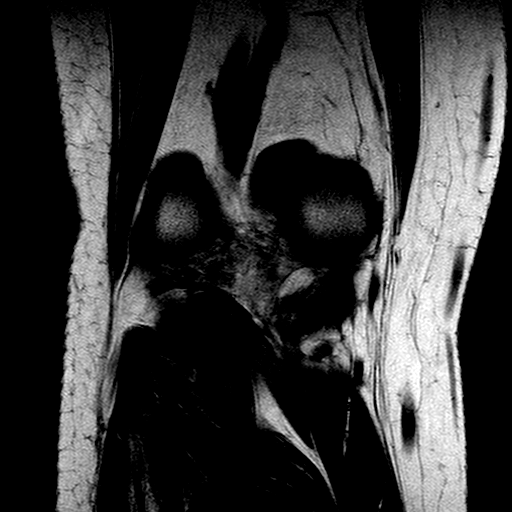
[im 26/33]
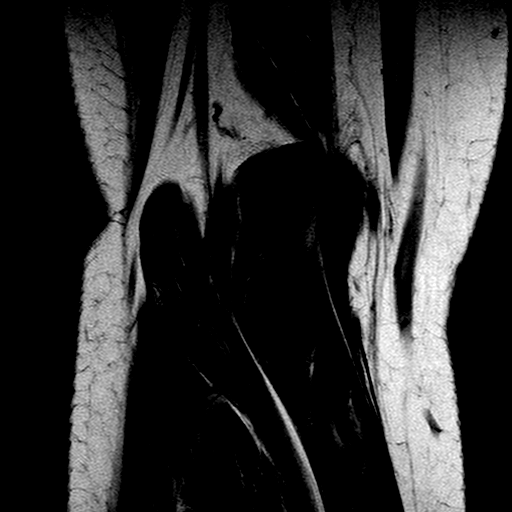
[im 29/33]
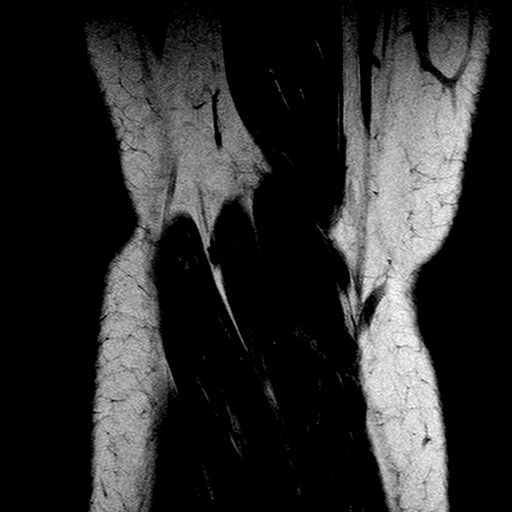
[im 33/33]
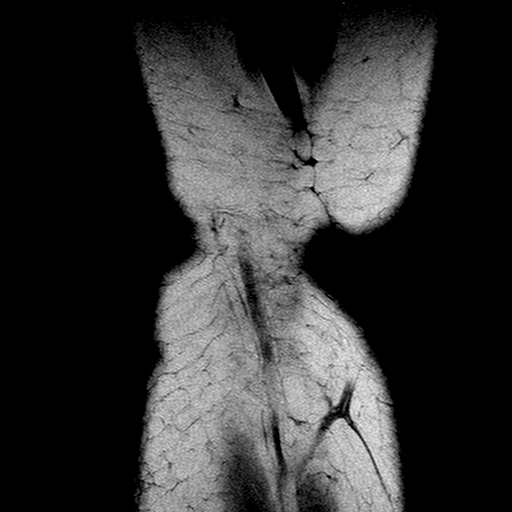

[20 of 40 positions shown; findings below may reference images not displayed]

FINDINGS: MEDIAL COMPARTMENT: Horizontal tear of the posterior horn of the medial meniscus 
and partial thickness tearing of the posterior medial meniscal root ligament. 3 
mm medial meniscal extrusion. Articular cartilage is preserved. 
LATERAL COMPARTMENT: The lateral meniscus is intact without tear or extrusion. 
Articular cartilage is preserved. 
PATELLOFEMORAL COMPARTMENT: The patella is centrally located. Grade III 
cartilage fissuring of the medial patellar facet. No focal trochlear 
cartilaginous defects. 
TIBIOFIBULAR COMPARTMENT: Negative. 
LIGAMENTS: The anterior cruciate ligament is intact. The posterior cruciate 
ligament is intact. The medial collateral ligament and lateral collateral 
ligaments are preserved. 
EXTENSOR MECHANISM: The quadriceps and patellar tendon are preserved. The medial 
and lateral retinacula are intact. 
POSTEROMEDIAL CORNER: The semimembranosus and pes anserine tendons are 
preserved. The posterior oblique ligament and posterior medial joint capsule are 
intact. 
POSTEROLATERAL CORNER: The popliteal tendon and popliteofibular ligament are 
intact. The biceps femoris is negative. 
BONES: No fracture. Minimal posterolateral tibial subchondral marrow edema 
subjacent to the posterior medial meniscal root ligament. 
ADDITIONAL FINDINGS: Small knee joint effusion. Non-thickened plica. 3.2 x 1.5 x 
6.0 cm popliteal cyst. The musculature is symmetric without mass, signal 
abnormality or atrophy. Subcutaneous tissues are negative. Neurovascular bundles 
are negative.
IMPRESSION: 1.  Horizontal tear of the posterior horn of the medial meniscus, partial 
thickness tearing of the posterior medial meniscal root ligament, and medial 
meniscal extrusion. 
2.  Mild patellar chondromalacia. 
3.  Small knee joint effusion and moderate popliteal cyst.

## 2020-09-08 IMAGING — MR MRI RIGHT FOOT WITHOUT CONTRAST
4 of 7 series · 21 of 40 positions shown · IV contrast (gadolinium)
Comparison: None prior of the foot.

LOWE, ROME 
MRI RIGHT FOOT WITHOUT CONTRAST, 09/08/2020 [DATE]: 
CLINICAL INDICATION: Injury 2 years ago. Stomach of 4 x 4's landed on top of 
foot. Crush fracture. Chronic pain and swelling entire right foot. Sensitivity 
to touch. Recent right knee surgery. Evaluate for stress fracture. Severe pain 
in metatarsals.
TECHNIQUE: Multiplanar, multiecho position MR images of the foot were performed 
without intravenous gadolinium enhancement.

[Series 101: survey_fullfov_transversal · axial · 10.0mm · 1.84mm/px · z∈[-82,+284]mm · 5 of 36 slices shown]
[im 1/36]
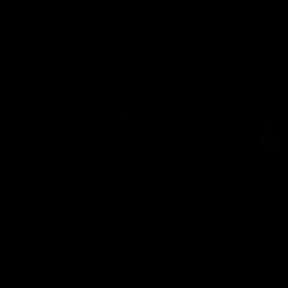
[im 9/36]
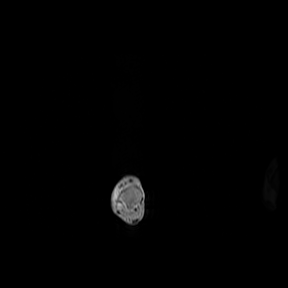
[im 18/36]
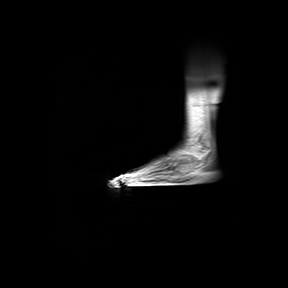
[im 27/36]
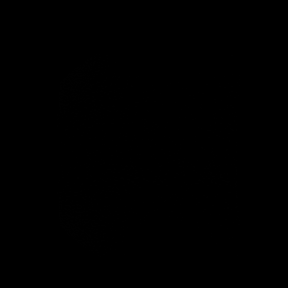
[im 36/36]
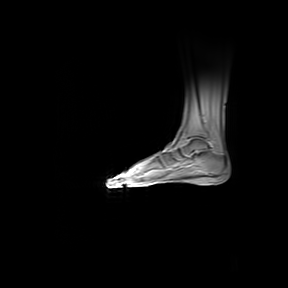

[Series 201: survey_mst · axial · 10.0mm · 0.94mm/px · z∈[-79,+191]mm · 2 of 15 slices shown]
[im 1/15]
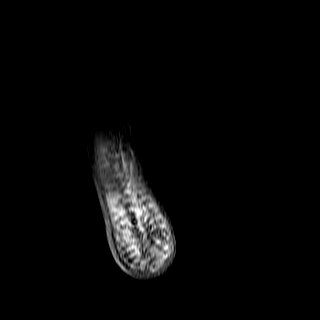
[im 15/15]
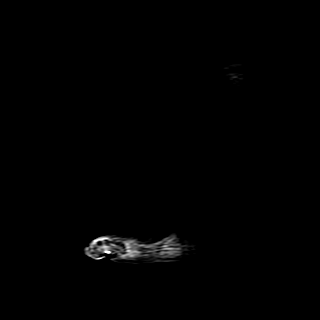

[Series 401: PD fat-sat · sagittal · 2.5mm · 0.35mm/px · 5 of 32 slices shown]
[im 1/32]
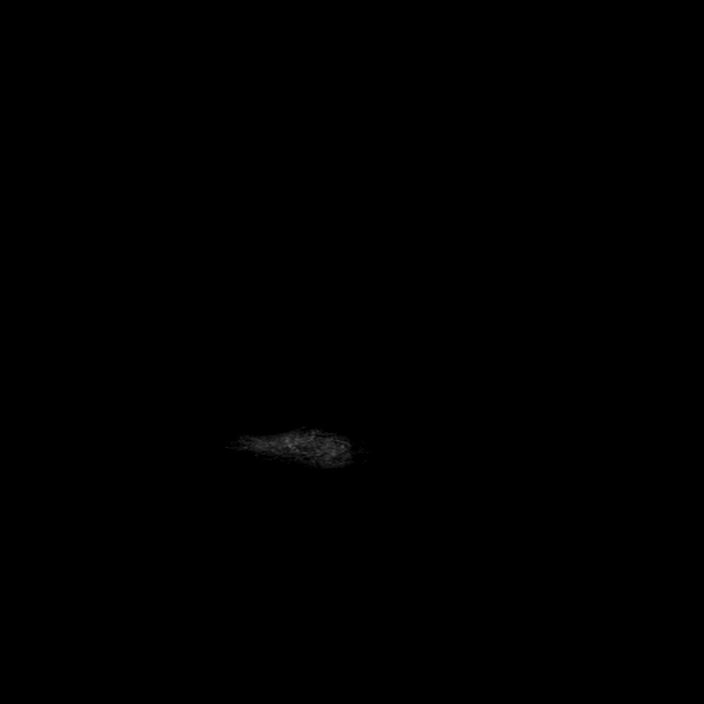
[im 8/32]
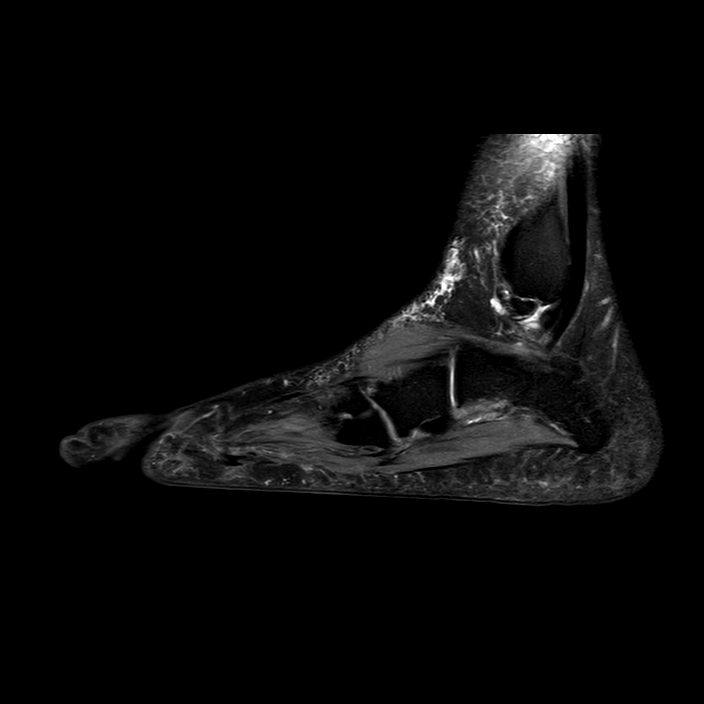
[im 16/32]
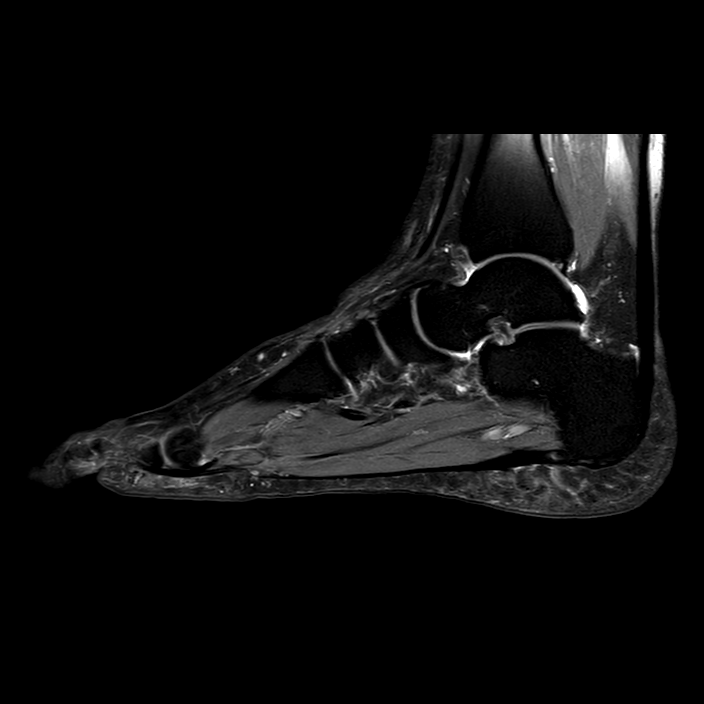
[im 24/32]
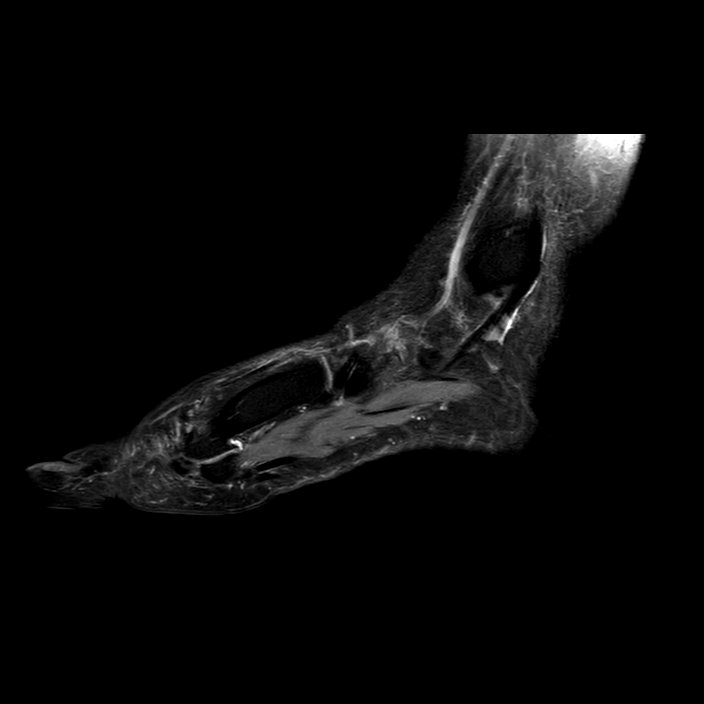
[im 32/32]
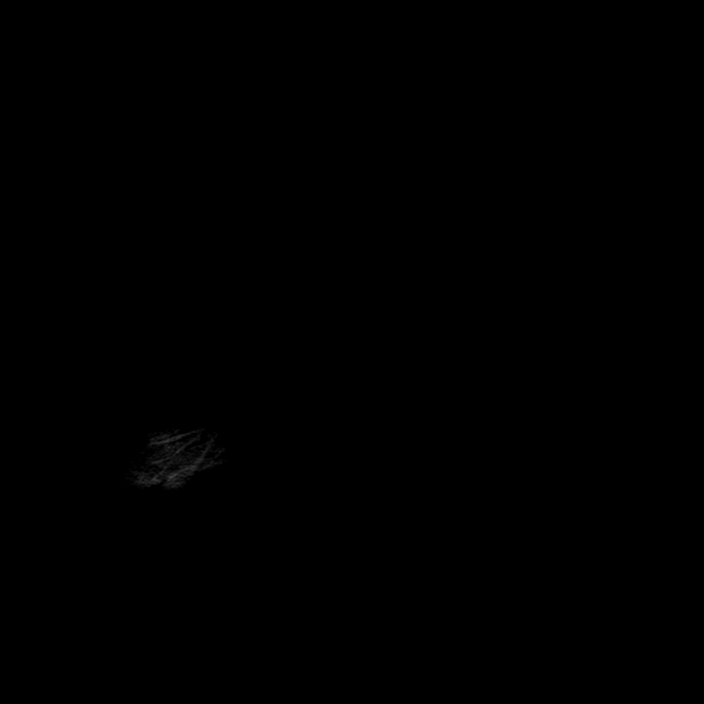

[Series 601: T1 · coronal · 3.0mm · 0.27mm/px · 9 of 56 slices shown]
[im 1/56]
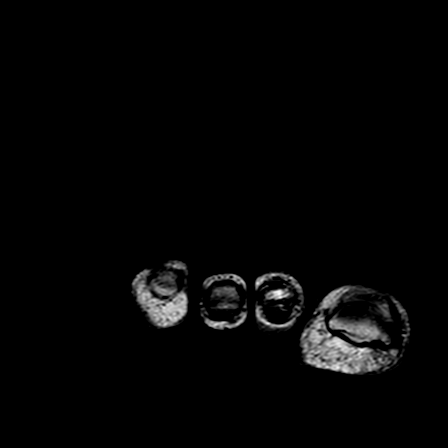
[im 7/56]
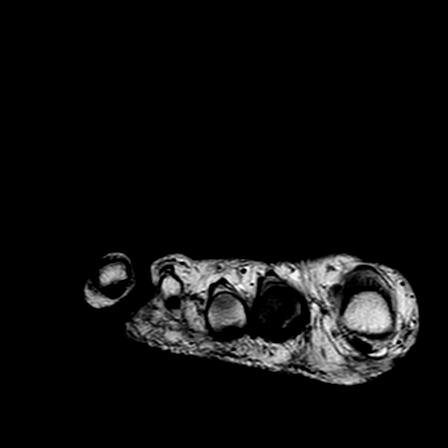
[im 14/56]
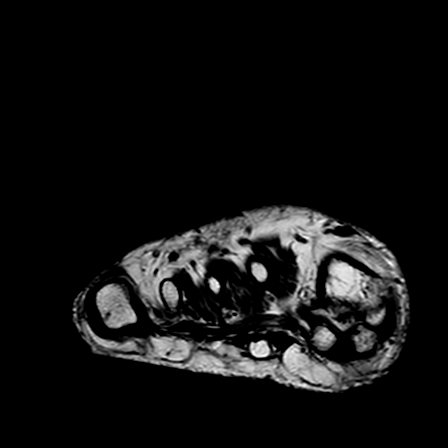
[im 21/56]
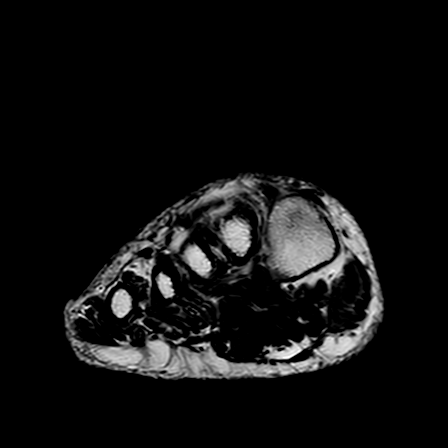
[im 28/56]
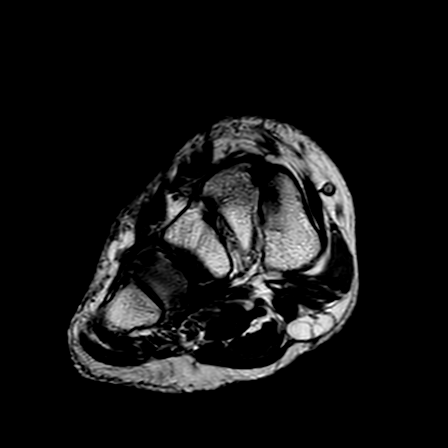
[im 35/56]
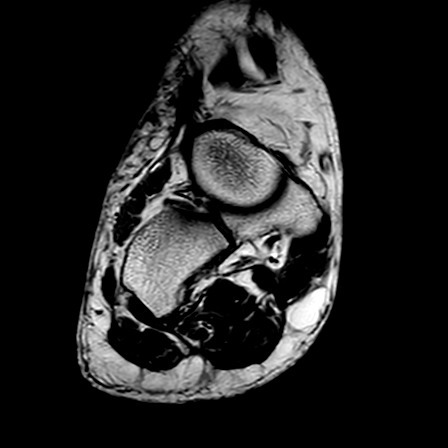
[im 42/56]
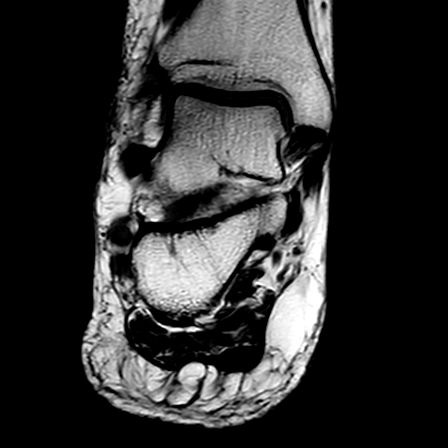
[im 49/56]
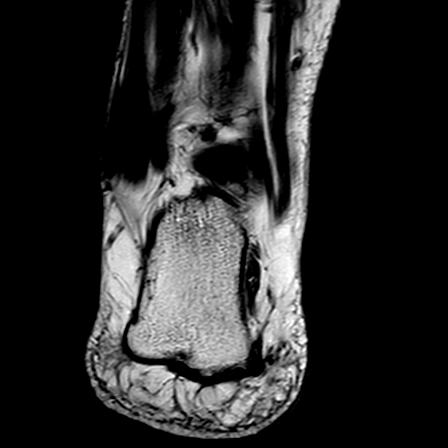
[im 56/56]
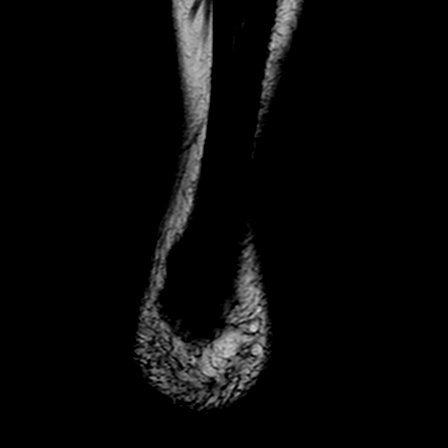

[21 of 40 positions shown; findings below may reference images not displayed]

FINDINGS: TENDONS: Longitudinal split thickness tearing of the peroneal brevis at and 
distal to the level of the fibular groove. Slight tenosynovitis of the distal 
posterior tibialis tendon. Extensor tendons are preserved. Achilles is intact. 
LIGAMENTS: 
LATERAL LIGAMENTS:Chronic partial tearing of the anterior talofibular ligament. 
The calcaneofibular ligament and posterior talofibular ligament are preserved. 
SYNDESMOTIC LIGAMENTS:The anterior and posterior tibiofibular and interosseous 
ligaments are preserved. 
DELTOID LIGAMENTOUS COMPLEX:The deep and superficial components of the deltoid 
ligamentous complex are intact. 
SINUS TARSI LIGAMENTS: The cervical and interosseous ligaments are preserved. 
The inferior extensor retinaculum appears intact. 
BONES AND JOINTS: There are scattered areas of articular cartilaginous loss, 
greatest involving the navicular-medial cuneiform articulation and first MTP 
articulation. There is osseous remodeling, osteophytic spurring and subcortical 
cystic change. No fracture. Specifically, no metatarsal stress fracture or 
stress response. Small effusion at the first MCP level. Talar dome is intact. No 
focal osteochondral lesion. Old well-corticated avulsive fragment versus ossicle 
distal to the fibular tip measuring 5 mm. 
MUSCLES: Musculature is symmetric without mass, signal abnormality or atrophy. 
OTHER SOFT TISSUES: Tarsal tunnel is preserved. Sinus tarsi fat is preserved. 
Prominent calcaneal spur. There is mild thickening of the plantar fascia the 
calcaneal attachment.
IMPRESSION: 1.  Moderately advanced degenerative changes with greatest involvement of the 
navicular-medial cuneiform and first MTP joints. 
2.  Chronic plantar fasciitis. 
3.  Longitudinal split thickness tearing of the peroneal brevis. 
4.  Mild tenosynovitis of the distal posterior tibialis.

## 2021-07-13 IMAGING — MR MRI BRAIN WITHOUT CONTRAST
7 of 11 series · 21 of 48 positions shown · IV contrast (gadolinium)
Comparison: None.

________________________________________________________________________________________________ 
MRI BRAIN WITHOUT CONTRAST, 07/13/2021 [DATE]: 
CLINICAL INDICATION: Daily headaches and altered mental status.
TECHNIQUE: Multiplanar, multiecho position MR images of the brain were performed 
without intravenous gadolinium enhancement. Patient was scanned on a
magnet.

[Series 102: mpr - smartbrain · axial · 1.1mm · 1.09mm/px · 1 of 2 slices shown]
[im 1/2]
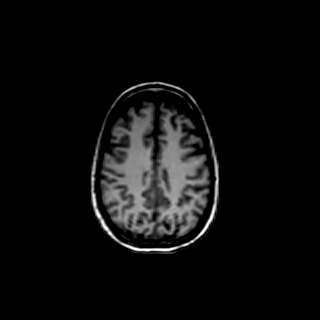

[Series 201: t1_se_sag · sagittal · 4.0mm · 0.43mm/px · 2 of 28 slices shown]
[im 1/28]
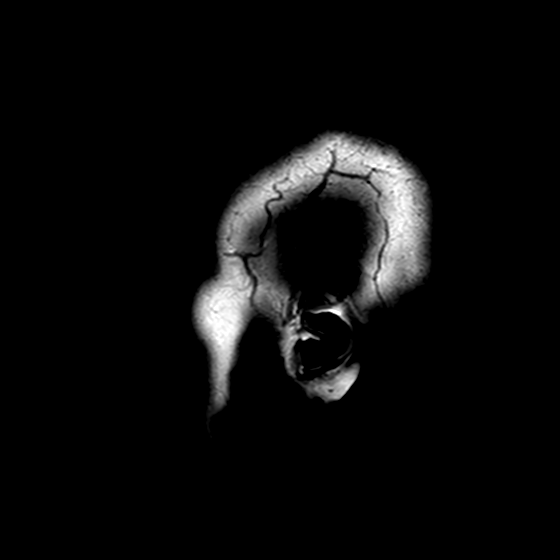
[im 28/28]
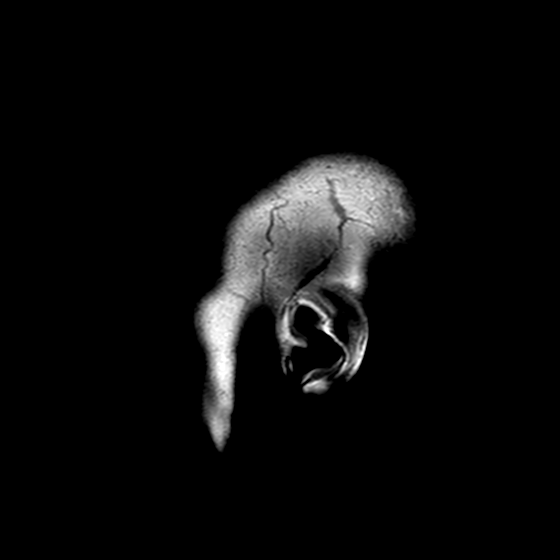

[Series 301: flair_ax · axial · 5.0mm · 0.49mm/px · z∈[-125,+30]mm · 2 of 27 slices shown]
[im 1/27]
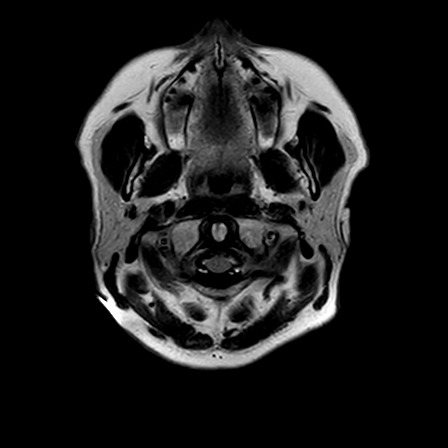
[im 27/27]
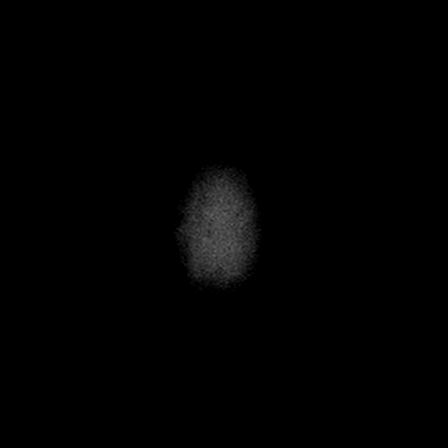

[Series 403: dadc map · axial · 5.0mm · 1.03mm/px · z∈[-130,+25]mm · 2 of 27 slices shown]
[im 1/27]
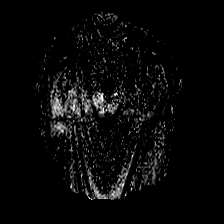
[im 27/27]
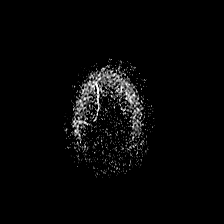

[Series 404: (id) · axial · 5.0mm · 1.03mm/px · z∈[-130,+25]mm · 2 of 24 slices shown]
[im 1/24]
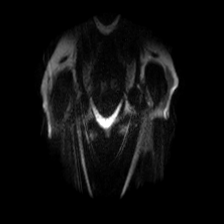
[im 24/24]
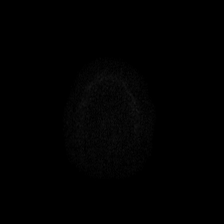

[Series 501: t1w_se_ax · axial · 5.0mm · 0.43mm/px · 1 of 27 slices shown]
[im 1/27]
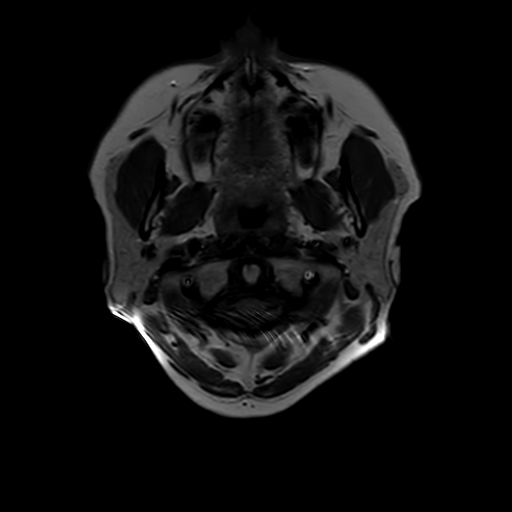

[Series 601: SWI · axial · 3.0mm · 0.40mm/px · z∈[-115,+14]mm · 11 of 200 slices shown]
[im 13/200]
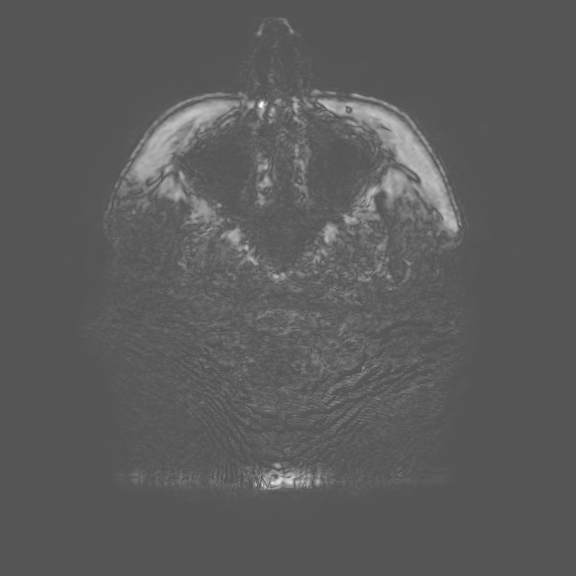
[im 25/200]
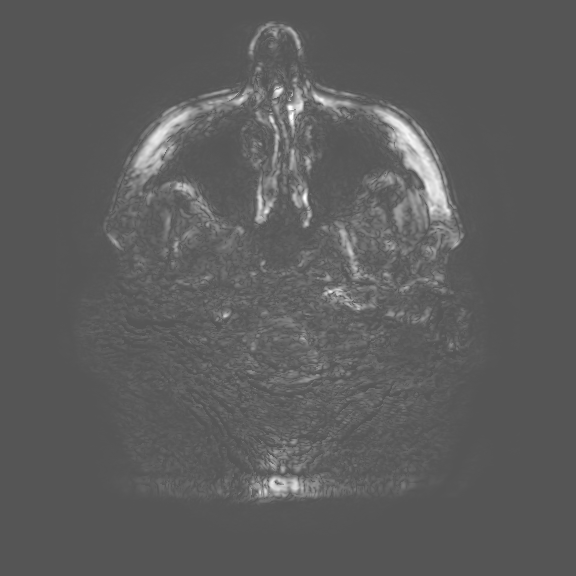
[im 38/200]
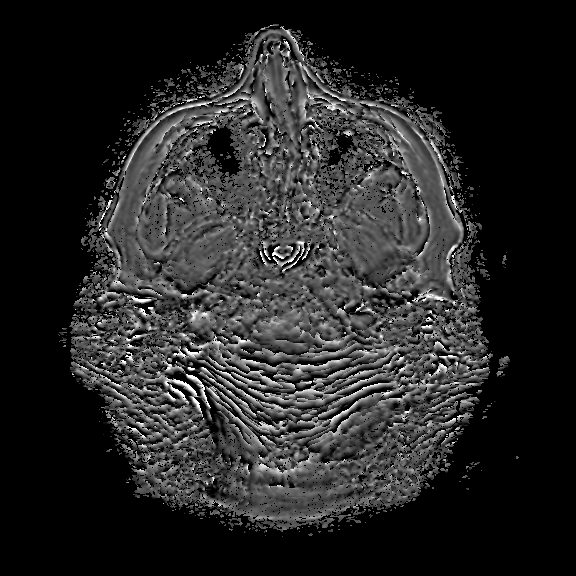
[im 63/200]
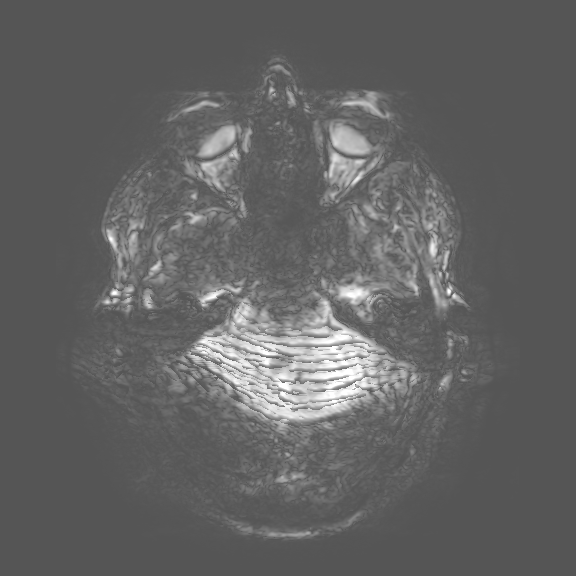
[im 88/200]
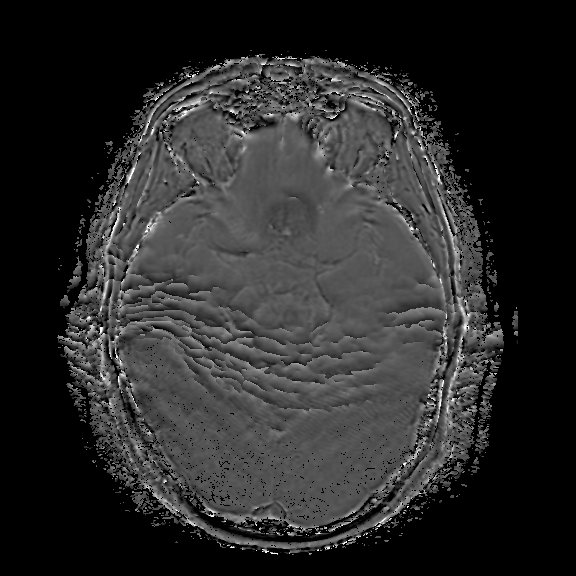
[im 100/200]
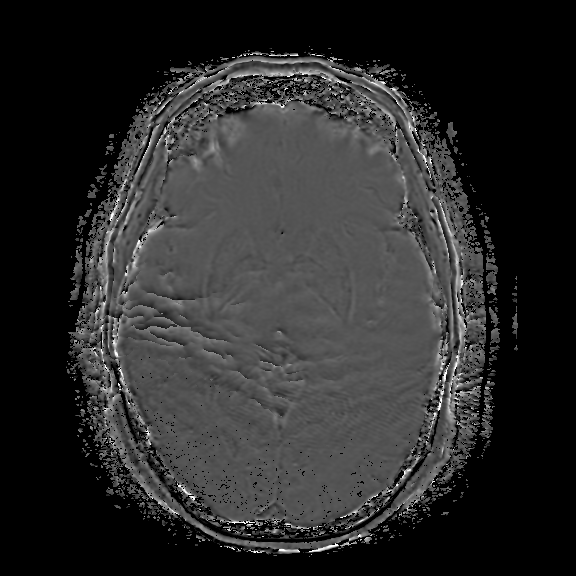
[im 112/200]
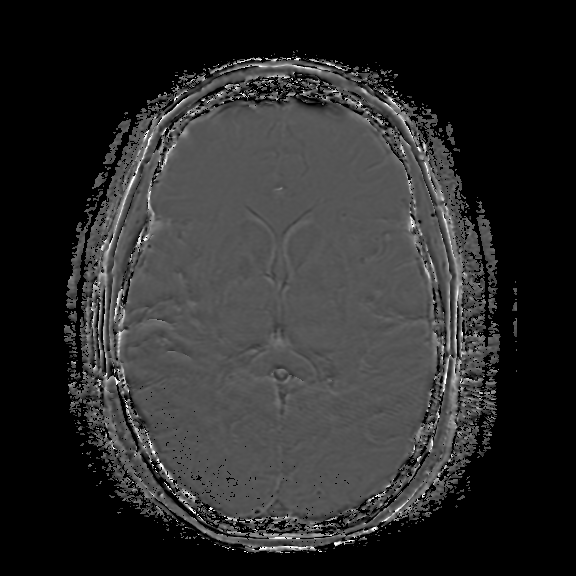
[im 137/200]
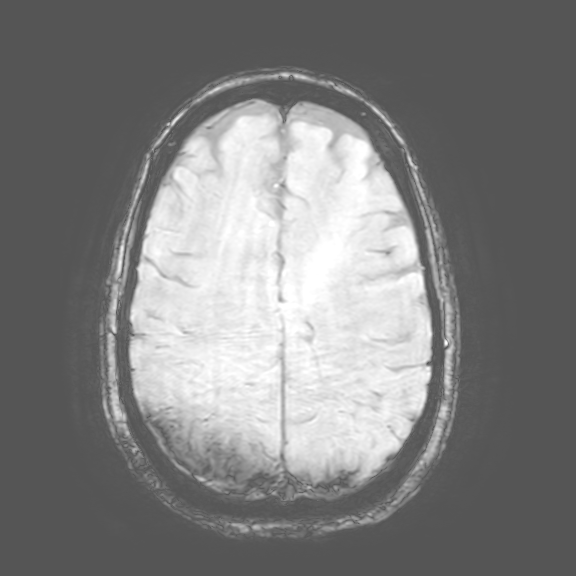
[im 162/200]
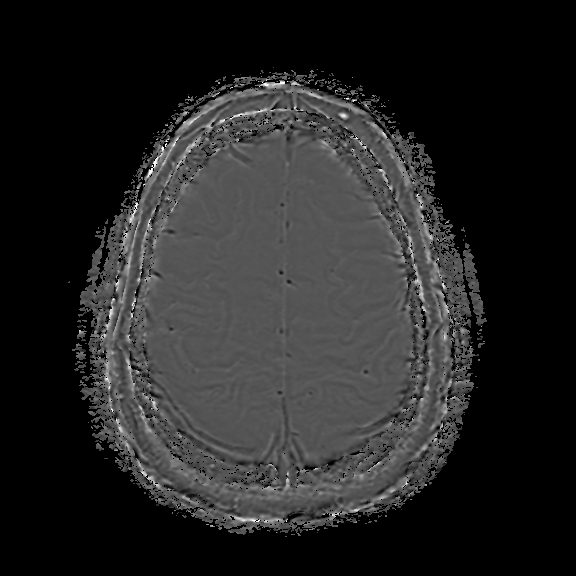
[im 175/200]
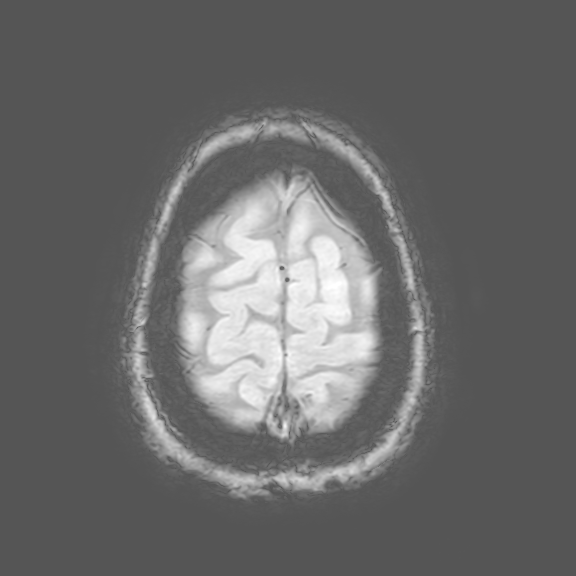
[im 187/200]
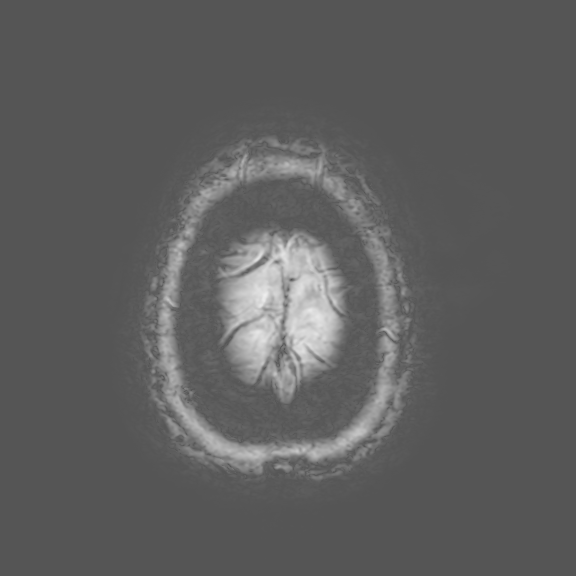

[21 of 48 positions shown; findings below may reference images not displayed]

FINDINGS: EXTRAAXIAL SPACE: Ventricles appear age appropriate and maintain midline 
position. No collections, mass lesion or significant signal abnormality. Signal 
voids of basilar and internal carotid arteries maintained. 
CEREBRUM: Superficial and deep cortical structures are well formed. No focal 
signal abnormality or mass effect present. White matter pathways including 
corpus callosum are intact. No restricted diffusion identified.   
CEREBELLUM: Cerebellar hemispheres and vermis are well formed without mass 
lesion or signal abnormality. Basal cisterns are maintained. 
BRAINSTEM: Midbrain, pons, and medulla are well formed without mass or signal 
abnormality. 
SKULL/EXTRACRANIAL STRUCTURES: Bilateral paranasal sinuses, mastoid air cells, 
and middle ear cavity without significant disease.  Pituitary fossa and orbits 
without definite mass.  Calvarium, visualized skull base and remainder of 
visualized upper neck appear unremarkable.
IMPRESSION: No acute intracranial abnormality. No acute infarct, intracranial hemorrhage or 
focal mass.

## 2022-03-09 IMAGING — US BREAST ULTRASOUND BILATERAL COMPLETE
1 series · 1 of 1 positions shown · non-contrast
Comparison: Comparison was made to prior examinations.

________________________________________________________________________________________________ 
MAMMOGRAPHY DIAGNOSTIC BILATERAL 3SIBELIS ROBAYO, BREAST ULTRASOUND BILATERAL COMPLETE, 
03/09/2022 [DATE]: 
CLINICAL INDICATION: Right breast pain . Previous benign biopsy of the left 
breast.
TECHNIQUE: Digital bilateral mammograms and 3-D Tomosynthesis were obtained. 
These were interpreted both primarily and with the aid of computer-aided 
detection system. Bilateral whole breast ultrasound also performed. Only 
previous left mammogram from 05/21/2015 is available for comparison at time of 
dictation. 
BREAST DENSITY: (Level C) The breasts are heterogeneously dense, which may 
obscure small masses.

[Series 1: breast ultrasound bilateral limited · 1 of 124 frames shown]
[frame 63/124]
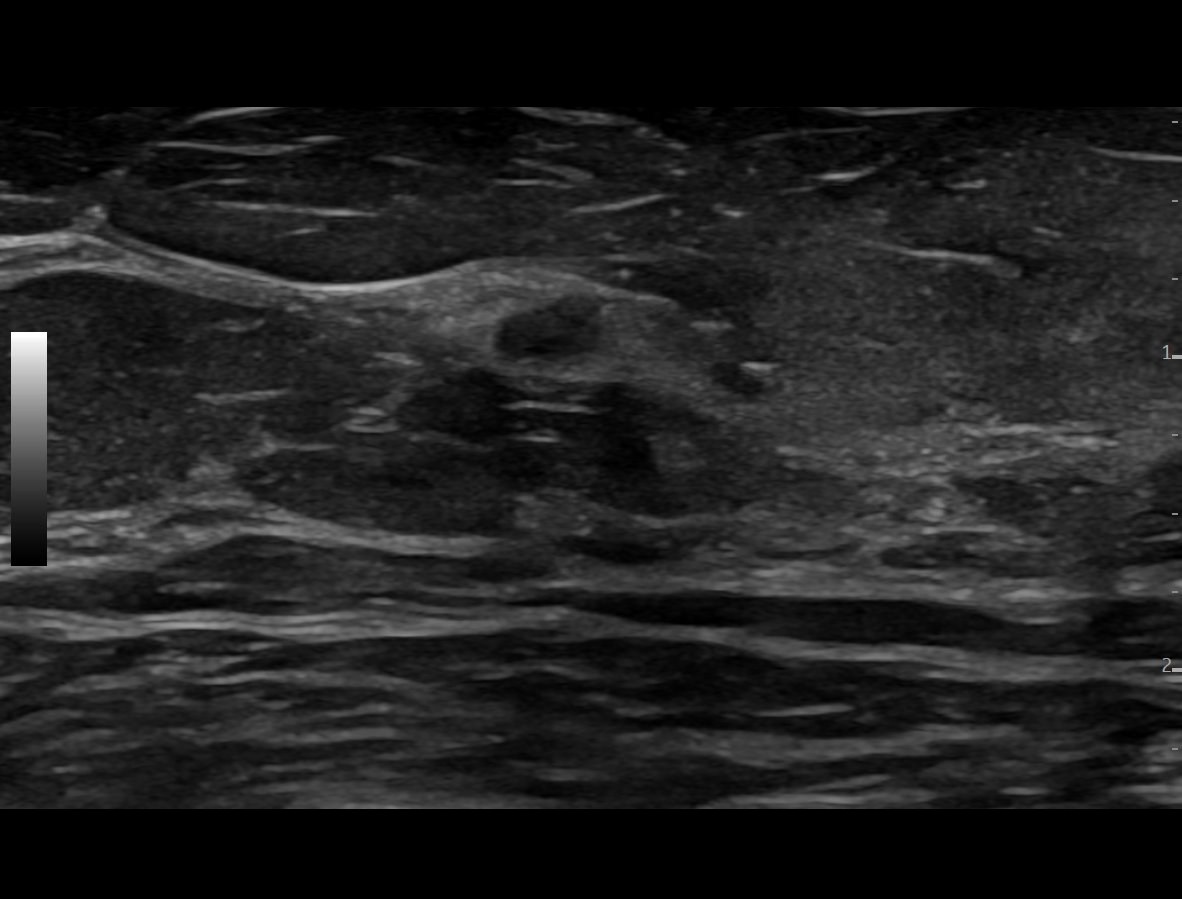

[1 of 1 positions shown; findings below may reference images not displayed]

FINDINGS: There is scarring in the left breast. There is also nodularity in the 
lower left breast on MLO view, as well as the medial left breast on craniocaudal 
projection, this will be evaluated with ultrasound. There are suspected scarring 
in the retroareolar region of the left breast which is not significantly 
changed. No dominant mass in the right breast and no suspicious clusters of 
microcalcifications in either breast. 
On bilateral whole breast ultrasound, the right breast demonstrates a hypoechoic 
structure at the [DATE] position 4 cm from the nipple measuring 2 x 4 x 1 mm, 
complex cyst at the [DATE] position 2 cm from nipple measuring 8 x 9 x 2 mm and a 
complex cyst at the [DATE] position 2 cm from nipple measuring 4 x 6 x 2 mm. 
The left breast demonstrates: Hypoechoic focus at the [DATE] position 4 cm from 
nipple measuring 2 x 2 x 1 mm, hypoechoic focus at the [DATE] position 1 cm from 
the nipple measuring 3 x 2 x 1 mm, hypoechoic structure at the [DATE] position of 
the left breast 8 cm from nipple measuring 3 x 4 x 2 mm and complex cyst in the 
left breast at the [DATE] position 6 cm from nipple measuring  6 x 9 x 4 mm. At 
site of biopsy there is a hypoechoic nodule near the retroareolar region of the 
left breast at about [DATE] position measuring 5 x 6 x 5 mm with some posterior 
shadowing which could be due to scarring. The above finding both breasts are 
generally felt to be benign, however the hypoechoic nodular lesion in the left 
breast is concerning and could still be related to scarring, but would recommend 
MRI for further evaluation.
IMPRESSION: (BI-RADS 0) Further evaluation with MRI is suggested.

## 2022-03-09 IMAGING — MG MAMMOGRAPHY DIAGNOSTIC BILATERAL 3[PERSON_NAME]
8 series · 8 of 24 positions shown · non-contrast
Comparison: Comparison was made to prior examinations.

________________________________________________________________________________________________ 
MAMMOGRAPHY DIAGNOSTIC BILATERAL 3SIBELIS ROBAYO, BREAST ULTRASOUND BILATERAL COMPLETE, 
03/09/2022 [DATE]: 
CLINICAL INDICATION: Right breast pain . Previous benign biopsy of the left 
breast.
TECHNIQUE: Digital bilateral mammograms and 3-D Tomosynthesis were obtained. 
These were interpreted both primarily and with the aid of computer-aided 
detection system. Bilateral whole breast ultrasound also performed. Only 
previous left mammogram from 05/21/2015 is available for comparison at time of 
dictation. 
BREAST DENSITY: (Level C) The breasts are heterogeneously dense, which may 
obscure small masses.

[R MLO]
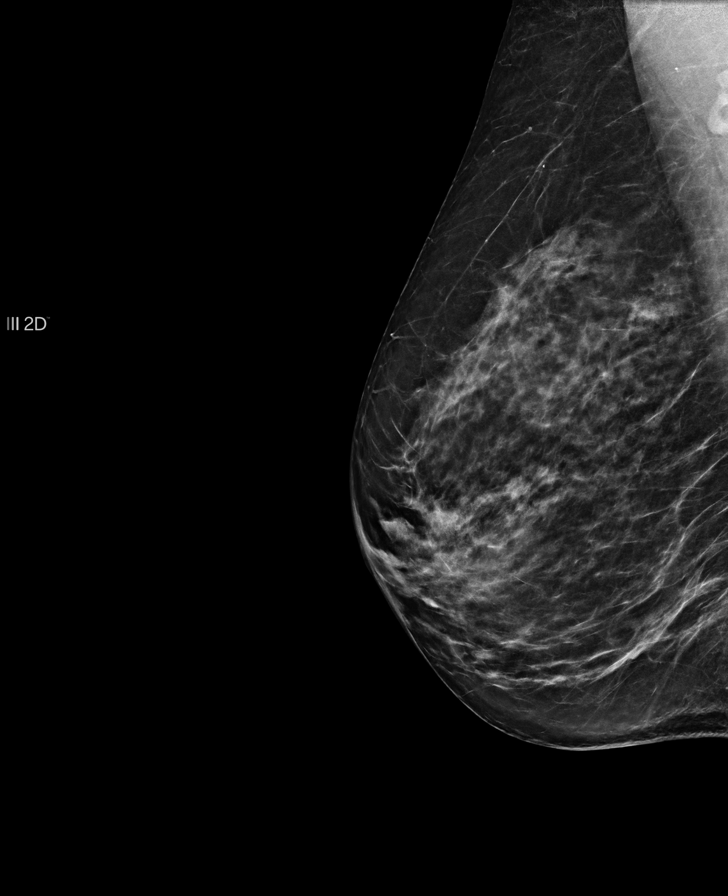

[L CC]
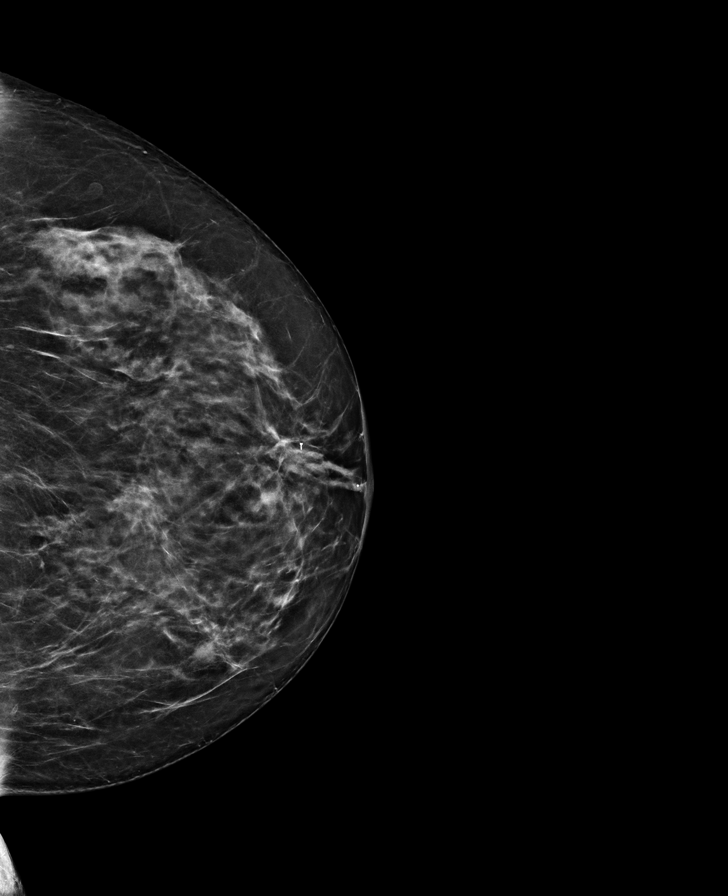

[R CC]
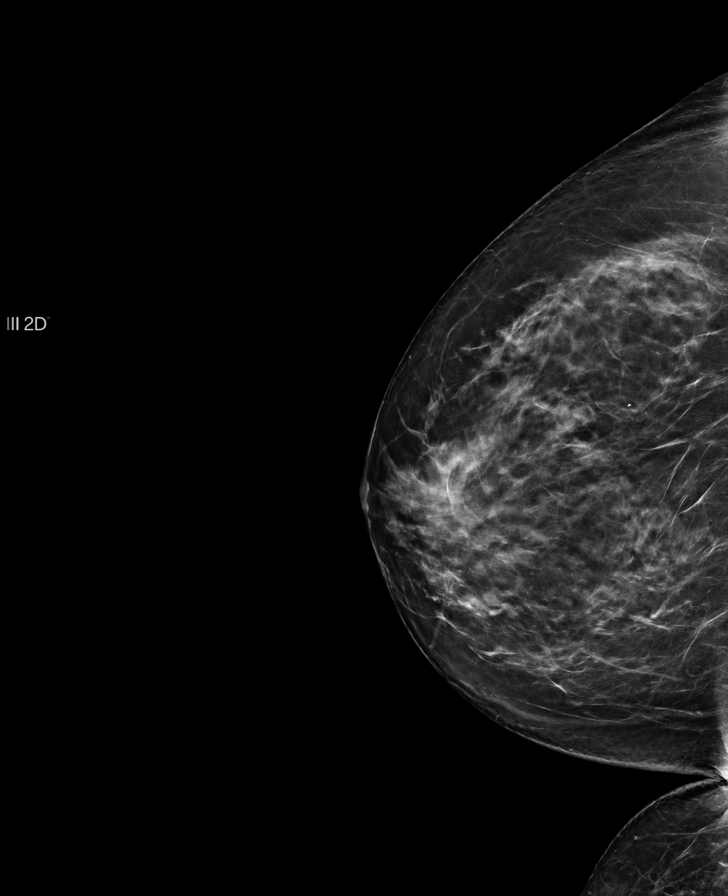

[L MLO]
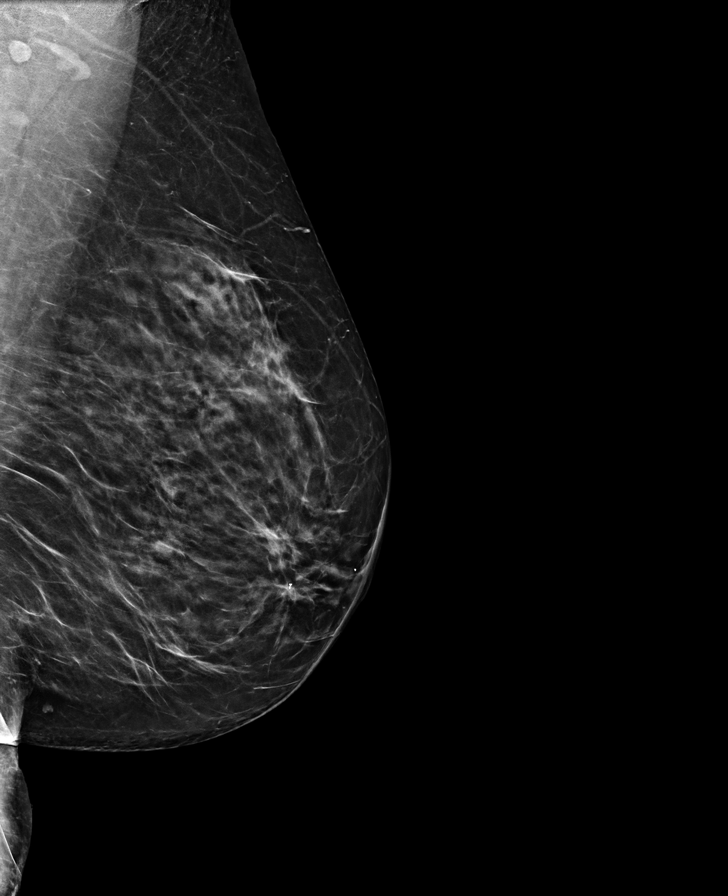

[R MLO tomo · tomo slice 33/66.0]
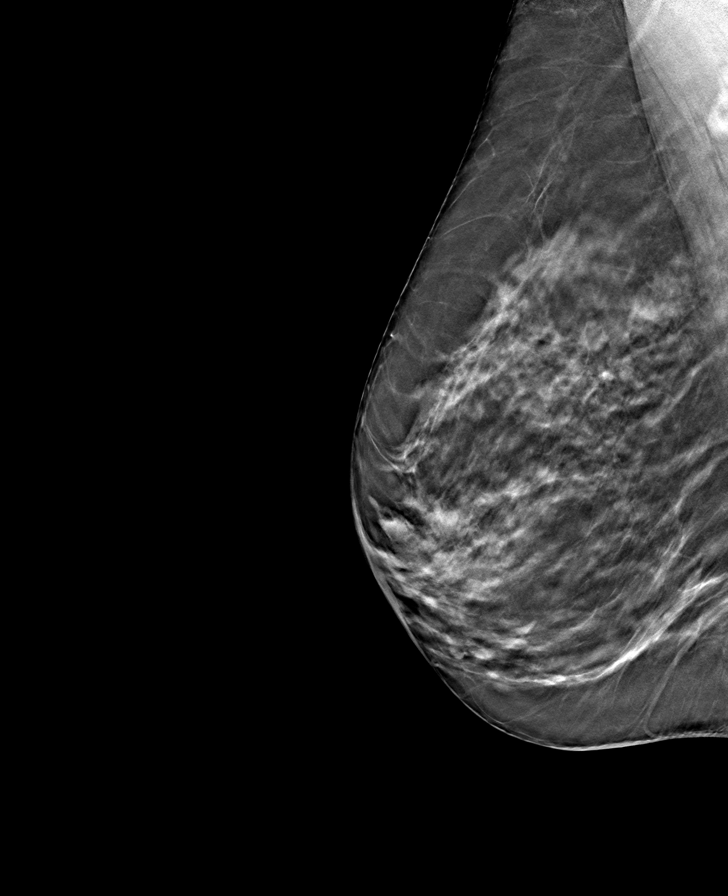

[L CC tomo · tomo slice 35/68.0]
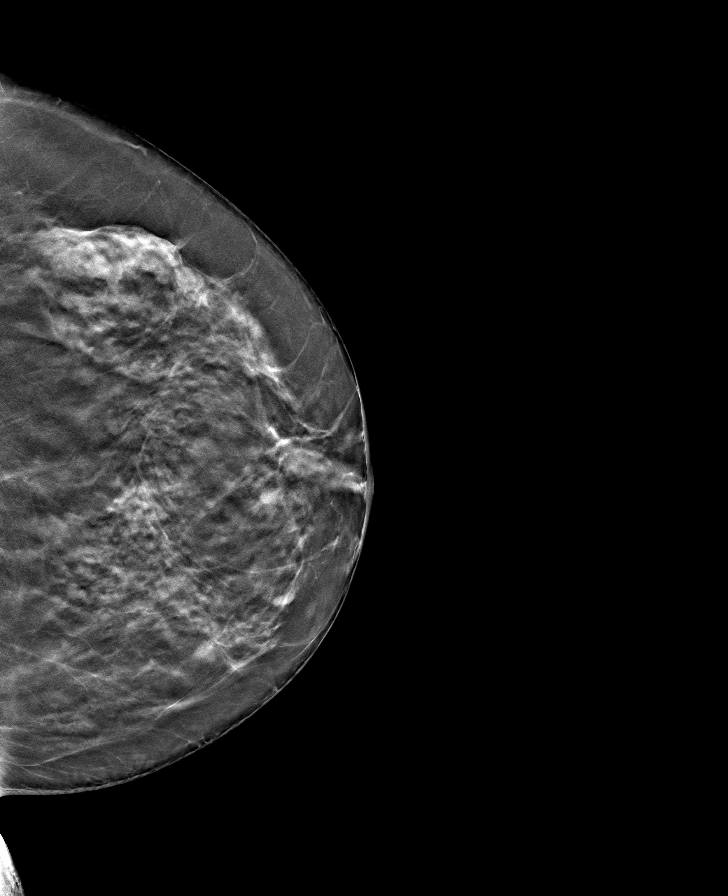

[R CC tomo · tomo slice 37/72.0]
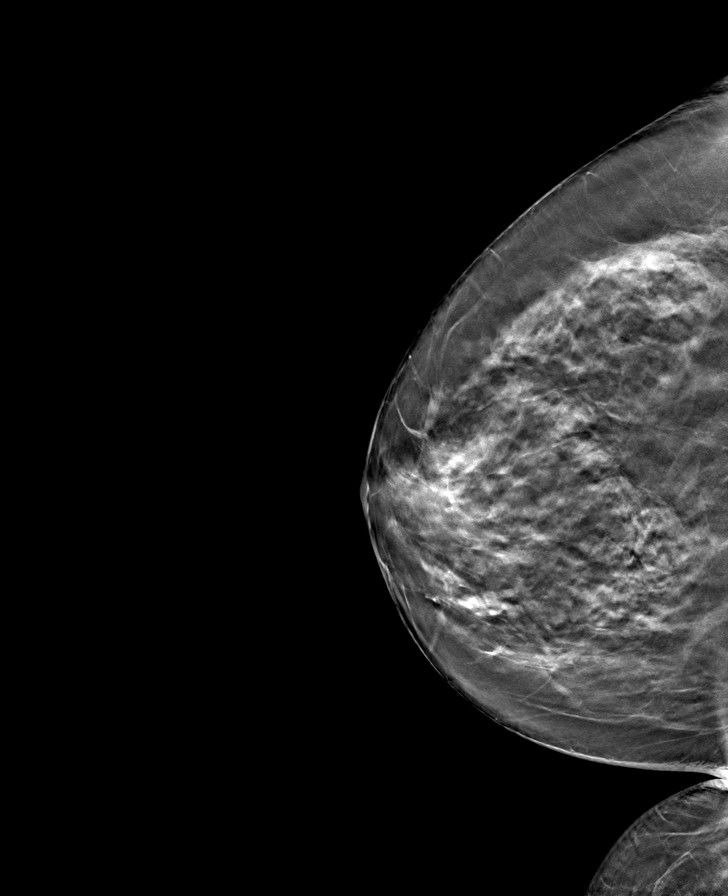

[L MLO tomo · tomo slice 37/73.0]
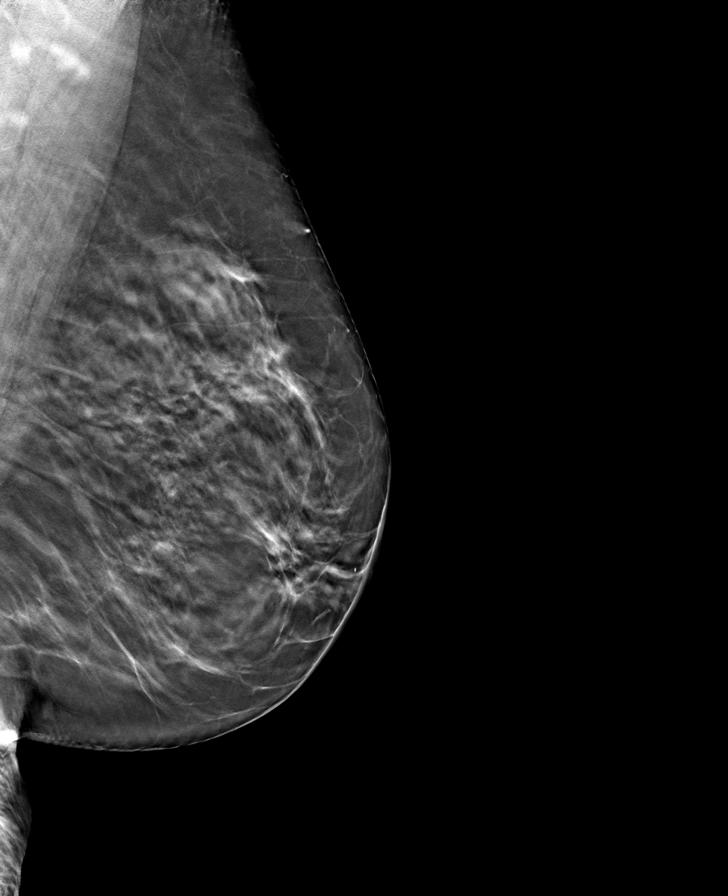

[8 of 24 positions shown; findings below may reference images not displayed]

FINDINGS: There is scarring in the left breast. There is also nodularity in the 
lower left breast on MLO view, as well as the medial left breast on craniocaudal 
projection, this will be evaluated with ultrasound. There are suspected scarring 
in the retroareolar region of the left breast which is not significantly 
changed. No dominant mass in the right breast and no suspicious clusters of 
microcalcifications in either breast. 
On bilateral whole breast ultrasound, the right breast demonstrates a hypoechoic 
structure at the [DATE] position 4 cm from the nipple measuring 2 x 4 x 1 mm, 
complex cyst at the [DATE] position 2 cm from nipple measuring 8 x 9 x 2 mm and a 
complex cyst at the [DATE] position 2 cm from nipple measuring 4 x 6 x 2 mm. 
The left breast demonstrates: Hypoechoic focus at the [DATE] position 4 cm from 
nipple measuring 2 x 2 x 1 mm, hypoechoic focus at the [DATE] position 1 cm from 
the nipple measuring 3 x 2 x 1 mm, hypoechoic structure at the [DATE] position of 
the left breast 8 cm from nipple measuring 3 x 4 x 2 mm and complex cyst in the 
left breast at the [DATE] position 6 cm from nipple measuring  6 x 9 x 4 mm. At 
site of biopsy there is a hypoechoic nodule near the retroareolar region of the 
left breast at about [DATE] position measuring 5 x 6 x 5 mm with some posterior 
shadowing which could be due to scarring. The above finding both breasts are 
generally felt to be benign, however the hypoechoic nodular lesion in the left 
breast is concerning and could still be related to scarring, but would recommend 
MRI for further evaluation.
IMPRESSION: (BI-RADS 0) Further evaluation with MRI is suggested.

## 2022-10-09 NOTE — ED Notes (Signed)
Formatting of this note might be different from the original.  Written discharge instructions, prescriptions and recommended follow-up were provided to patient by RN. Pt verbalized understanding. All medical equipment removed prior to departure. Pt ambulatory from ED in stable condition with all personal belongings.   Electronically signed by Marylou Flesher, RN at 10/09/2022  2:35 PM EDT
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
## 2022-10-14 ENCOUNTER — Emergency Department: Admit: 2022-10-14 | Payer: BLUE CROSS/BLUE SHIELD | Primary: Internal Medicine

## 2022-10-14 ENCOUNTER — Inpatient Hospital Stay
Admit: 2022-10-14 | Discharge: 2022-10-14 | Disposition: A | Discharge status: Home / Self Care | Payer: BLUE CROSS/BLUE SHIELD | Attending: Emergency Medicine

## 2022-10-14 DIAGNOSIS — G44309 Post-traumatic headache, unspecified, not intractable: Secondary | ICD-10-CM

## 2022-10-14 DIAGNOSIS — R42 Dizziness and giddiness: Secondary | ICD-10-CM

## 2022-10-14 LAB — URINALYSIS WITH REFLEX TO CULTURE
BACTERIA, URINE: NEGATIVE /hpf
Bilirubin, Urine: NEGATIVE
Blood, Urine: NEGATIVE
Glucose, Ur: NEGATIVE mg/dL
Ketones, Urine: NEGATIVE mg/dL
Leukocyte Esterase, Urine: NEGATIVE
Nitrite, Urine: NEGATIVE
Protein, UA: NEGATIVE mg/dL
Specific Gravity, UA: <1.005 (ref 1.003–1.030)
Urobilinogen, Urine: 0.2 EU/dL (ref 0.2–1.0)
pH, Urine: 7.5 (ref 5.0–8.0)

## 2022-10-14 LAB — URINE DRUG SCREEN
Amphetamine, Urine: NEGATIVE
Barbiturates, Urine: NEGATIVE
Benzodiazepines, Urine: NEGATIVE
Cocaine, Urine: NEGATIVE
Methadone, Urine: NEGATIVE
Opiates, Urine: NEGATIVE
Phencyclidine, Urine: NEGATIVE
THC, TH-Cannabinol, Urine: NEGATIVE

## 2022-10-14 LAB — COMPREHENSIVE METABOLIC PANEL
ALT: 33 U/L (ref 12–78)
AST: 22 U/L (ref 15–37)
Albumin/Globulin Ratio: 1.1 (ref 1.1–2.2)
Albumin: 3.8 g/dL (ref 3.5–5.0)
Alk Phosphatase: 89 U/L (ref 45–117)
Anion Gap: 2 mmol/L — ABNORMAL LOW (ref 5–15)
BUN/Creatinine Ratio: 19 (ref 12–20)
BUN: 15 MG/DL (ref 6–20)
CO2: 26 mmol/L (ref 21–32)
Calcium: 9 MG/DL (ref 8.5–10.1)
Chloride: 110 mmol/L — ABNORMAL HIGH (ref 97–108)
Creatinine: 0.77 MG/DL (ref 0.55–1.02)
Est, Glom Filt Rate: >90 mL/min/{1.73_m2} (ref >60)
Globulin: 3.5 g/dL (ref 2.0–4.0)
Glucose: 110 mg/dL — ABNORMAL HIGH (ref 65–100)
Potassium: 3.8 mmol/L (ref 3.5–5.1)
Sodium: 138 mmol/L (ref 136–145)
Total Bilirubin: 0.6 MG/DL (ref 0.2–1.0)
Total Protein: 7.3 g/dL (ref 6.4–8.2)

## 2022-10-14 LAB — ETHANOL: Ethanol Lvl: <10 MG/DL (ref <10)

## 2022-10-14 LAB — CBC WITH AUTO DIFFERENTIAL
Basophils %: 1 % (ref 0–1)
Basophils Absolute: 0 10*3/uL (ref 0.0–0.1)
Eosinophils %: 1 % (ref 0–7)
Eosinophils Absolute: 0.1 10*3/uL (ref 0.0–0.4)
Hematocrit: 39.7 % (ref 35.0–47.0)
Hemoglobin: 13.7 g/dL (ref 11.5–16.0)
Immature Granulocytes %: 0 % (ref 0.0–0.5)
Immature Granulocytes Absolute: 0 10*3/uL (ref 0.00–0.04)
Lymphocytes %: 20 % (ref 12–49)
Lymphocytes Absolute: 1.6 10*3/uL (ref 0.8–3.5)
MCH: 31.3 PG (ref 26.0–34.0)
MCHC: 34.5 g/dL (ref 30.0–36.5)
MCV: 90.6 FL (ref 80.0–99.0)
MPV: 10.4 FL (ref 8.9–12.9)
Monocytes %: 6 % (ref 5–13)
Monocytes Absolute: 0.5 10*3/uL (ref 0.0–1.0)
Neutrophils %: 72 % (ref 32–75)
Neutrophils Absolute: 5.8 10*3/uL (ref 1.8–8.0)
Nucleated RBCs: 0 PER 100 WBC
Platelets: 268 10*3/uL (ref 150–400)
RBC: 4.38 M/uL (ref 3.80–5.20)
RDW: 12.8 % (ref 11.5–14.5)
WBC: 8 10*3/uL (ref 3.6–11.0)
nRBC: 0 10*3/uL (ref 0.00–0.01)

## 2022-10-14 LAB — POCT GLUCOSE: POC Glucose: 125 mg/dL — ABNORMAL HIGH (ref 65–117)

## 2022-10-14 LAB — PROTIME-INR
INR: 1 (ref 0.9–1.1)
Protime: 10.2 s (ref 9.0–11.1)

## 2022-10-14 LAB — TROPONIN: Troponin, High Sensitivity: 5 ng/L (ref 0–51)

## 2022-10-14 MED ORDER — MECLIZINE HCL 25 MG PO TABS
25 | ORAL | Status: AC
Start: 2022-10-14 — End: 2022-10-14
  Administered 2022-10-14: 15:00:00 25 mg via ORAL

## 2022-10-14 MED ORDER — MECLIZINE HCL 25 MG PO TABS
25 | ORAL_TABLET | Freq: Three times a day (TID) | ORAL | 0 refills | Status: AC | PRN
Start: 2022-10-14 — End: ?

## 2022-10-14 MED ORDER — KETOROLAC TROMETHAMINE 15 MG/ML IJ SOLN
15 | Freq: Once | INTRAMUSCULAR | Status: AC
Start: 2022-10-14 — End: 2022-10-14
  Administered 2022-10-14: 15:00:00 15 mg via INTRAVENOUS

## 2022-10-14 MED ORDER — IOPAMIDOL 76 % IV SOLN
76 | Freq: Once | INTRAVENOUS | Status: AC | PRN
Start: 2022-10-14 — End: 2022-10-14
  Administered 2022-10-14: 14:00:00 140 mL via INTRAVENOUS

## 2022-10-14 MED FILL — KETOROLAC TROMETHAMINE 15 MG/ML IJ SOLN: 15 MG/ML | INTRAMUSCULAR | Qty: 1

## 2022-10-14 MED FILL — ISOVUE-370 76 % IV SOLN: 76 % | INTRAVENOUS | Qty: 200

## 2022-10-14 MED FILL — MECLIZINE HCL 25 MG PO TABS: 25 MG | ORAL | Qty: 1

## 2022-10-14 NOTE — ED Notes (Signed)
Signs and symptoms: Dizziness, slurred speech and repetitive phrases/answers to questions, HA, gait problems  Code Stroke activation time: [phone]  Provider at bedside time:  0953  VAN score: Positive, expressive aphasia  Last Known Well (Time): 2220 10/13/22  Blood Glucose Result/Time: 125 0953   Blood Pressure: 171/98  Anticoagulants (List medications): None

## 2022-10-14 NOTE — ED Notes (Signed)
1000 Pt to CT. In CT pt states she usually drinks multiple glasses of wine daily, with last drink approximately 1 week ago.

## 2022-10-14 NOTE — ED Notes (Signed)
Discharge instructions were reviewed and given to the patient. Patient given a current medication reconciliation form and verbalized understanding of their medications, side affects, medication administration, and time when due. Importance of follow-up and appointment times and dates reviewed. The patient verbalized understanding of discharge instructions and prescriptions, all questions were answered. Patient has no further concerns at this time. Patient stable at time of discharge.

## 2022-10-14 NOTE — ED Provider Notes (Signed)
[facility] EMERGENCY DEPT  EMERGENCY DEPARTMENT ENCOUNTER      Pt Name: Jessica Castaneda  MRN: [id number]  Birthdate [birth year]  Date of evaluation: 10/14/2022  Provider: Einar Grad, MD    CHIEF COMPLAINT       Chief Complaint   Patient presents with    Headache    Aphasia    Dizziness         HISTORY OF PRESENT ILLNESS   (Location/Symptom, Timing/Onset, Context/Setting, Quality, Duration, Modifying Factors, Severity)  Note limiting factors.   57 year old with a history of hypertension.  She presents accompanied by her sister (who helps provide the history).  She states that she awoke (approximately 3 hours ago) with severe dizziness this morning.  Her sister states that she is having a hard time walking without assistance.  She also complains of a severe right-sided headache, nausea, and trouble getting her words out.  She had a fall 6 days ago.  Her sister says she fell down 3 steps and hit her head on hard concrete.  She was evaluated at an outside ED in [place].  She apparently had negative imaging there.  Her sister says she has also been complaining of neck "burning."  She requests imaging of her neck.  The patient states that she has had dizziness in the past.  It typically resolves after a few minutes.          Review of External Medical Records:     Nursing Notes were reviewed.    REVIEW OF SYSTEMS    (2-9 systems for level 4, 10 or more for level 5)     Review of Systems    Except as noted above the remainder of the review of systems was reviewed and negative.       PAST MEDICAL HISTORY     Past Medical History:   Diagnosis Date    Hypertension     Rotator cuff disorder, right     Skin cancer          SURGICAL HISTORY       Past Surgical History:   Procedure Laterality Date    GYN      removed ovarian cyst    LAP,TUBAL CAUTERY      TUBAL LIGATION      Uterine Ablation         CURRENT MEDICATIONS       Previous Medications    CLOBETASOL (TEMOVATE) 0.05 % CREAM    Apply topically 2 times daily as  needed    ESCITALOPRAM (LEXAPRO) 20 MG TABLET    Take 1 tablet by mouth daily       ALLERGIES     Patient has no known allergies.    FAMILY HISTORY       Family History   Problem Relation Age of Onset    Heart Attack Father         multiple    Stroke Father         multiple    Celiac Disease Sister     Other Sister         fibromyalgia    Diabetes Father           SOCIAL HISTORY       Social History     Socioeconomic History    Marital status: Single   Tobacco Use    Smoking status: Every Day     Current packs/day: 1.00     Types: Cigarettes  Smokeless tobacco: Never   Substance and Sexual Activity    Alcohol use: Yes    Drug use: Never           PHYSICAL EXAM    (up to 7 for level 4, 8 or more for level 5)     ED Triage Vitals [10/14/22 0958]   BP Temp Temp Source Pulse Respirations SpO2 Height Weight - Scale   (!) 171/98 97.7 F (36.5 C) Oral 68 18 100 % -- 85.2 kg (187 lb 14.4 oz)       There is no height or weight on file to calculate BMI.    Physical Exam  Vitals and nursing note reviewed.   Constitutional:       Comments: Appears anxious and uncomfortable.   HENT:      Head: Normocephalic and atraumatic.      Mouth/Throat:      Mouth: Mucous membranes are moist.   Eyes:      Conjunctiva/sclera: Conjunctivae normal.   Cardiovascular:      Rate and Rhythm: Normal rate and regular rhythm.      Heart sounds: No murmur heard.     No friction rub. No gallop.   Pulmonary:      Effort: Pulmonary effort is normal.      Breath sounds: Normal breath sounds.   Abdominal:      General: There is no distension.      Tenderness: There is no abdominal tenderness.   Musculoskeletal:         General: No swelling or deformity.      Cervical back: No rigidity.   Skin:     General: Skin is warm and dry.   Neurological:      General: No focal deficit present.      Mental Status: She is alert.      Comments: Normal and equal upper/lower extremity strength.   Psychiatric:         Mood and Affect: Mood normal.         DIAGNOSTIC  RESULTS     EKG: All EKG's are interpreted by the Emergency Department Physician who either signs or Co-signs this chart in the absence of a cardiologist.    EKG: Normal sinus rhythm; rate of 60; normal ST, T.  Interpreted by me.  10:35 AM.  Einar Grad, MD      RADIOLOGY:   Non-plain film images such as CT, Ultrasound and MRI are read by the radiologist. Plain radiographic images are visualized and preliminarily interpreted by the emergency physician with the below findings:        Interpretation per the Radiologist below, if available at the time of this note:    CT HEAD WO CONTRAST    (Results Pending)   CTA HEAD NECK W CONTRAST    (Results Pending)   CT BRAIN PERFUSION    (Results Pending)        LABS:  Labs Reviewed   POCT GLUCOSE - Abnormal; Notable for the following components:       Result Value    POC Glucose 125 (*)     All other components within normal limits   CBC WITH AUTO DIFFERENTIAL   COMPREHENSIVE METABOLIC PANEL   PROTIME-INR   TROPONIN   POCT GLUCOSE       All other labs were within normal range or not returned as of this dictation.    EMERGENCY DEPARTMENT COURSE and DIFFERENTIAL DIAGNOSIS/MDM:   Vitals:    Vitals:  10/14/22 0958   BP: (!) 171/98   Pulse: 68   Resp: 18   Temp: 97.7 F (36.5 C)   TempSrc: Oral   SpO2: 100%   Weight: 85.2 kg (187 lb 14.4 oz)           Medical Decision Making  Amount and/or Complexity of Data Reviewed  Labs: ordered.  Radiology: ordered.  ECG/medicine tests: ordered.    Risk  Prescription drug management.            REASSESSMENT      Progress Note:  Results, treatment, and follow up plan have been discussed with patient/sister.  Questions were answered. Einar Grad, MD    CONSULTS:  IP CONSULT TO TELE-NEUROLOGY    Consult note: I spoke with the teleneurologist.  He will evaluate  Einar Grad, MD  10:05 AM    He saw the patient and suspects strongly that she is having peripheral vertigo.  He recommends meclizine and outpatient neuro follow-up.  Einar Grad,  MD      PROCEDURES:  Unless otherwise noted below, none     Procedures      FINAL IMPRESSION      Assessment/plan: 57 year old with a history of hypertension.  She presents with multiple complaints including severe dizziness, headache, nausea, trouble getting her words out.  She had a recent fall with head injury.  Differential diagnosis includes intracerebral hemorrhage, skull fracture, peripheral vertigo, postconcussive syndrome, stroke and others.  Reassuring appearance/exam with stable vital signs.  CBC, CMP, troponin, UA, UDS, head CT, CTA head and neck, CT perfusion studies okay.  The teleneurologist evaluated the patient and suspects peripheral vertigo.  Home with meclizine and PCP/neuro follow-up.  Return precautions.  Einar Grad, MD      DISPOSITION/PLAN   DISPOSITION        PATIENT REFERRED TO:  No follow-up provider specified.    DISCHARGE MEDICATIONS:  New Prescriptions    No medications on file         (Please note that portions of this note were completed with a voice recognition program.  Efforts were made to edit the dictations but occasionally words are mis-transcribed.)    Einar Grad, MD (electronically signed)  Emergency Attending Physician / Physician Assistant / Nurse Practitioner             Catalina Pizza, MD  10/14/22 1705

## 2022-10-14 NOTE — ED Triage Notes (Signed)
Patient to ER for reports of dizziness when she woke up this morning at 0630.     Patient reports right sided head pain.    Patient reports fall on 05/26. Patient reports she hit her head at that time.     Level 2 code stroke called at 0955.     S/S: dizziness, aphasia, headache.     LKW: 2220 on 10/13/22    Blood pressure: 171/98    Blood sugar: 125    Denies blood thinner use.     Dr. Delane Ginger at bedside during triage to assess patient.

## 2022-10-17 LAB — EKG 12-LEAD
Atrial Rate: 60 {beats}/min
Diagnosis: NORMAL
P Axis: 48 degrees
P-R Interval: 132 ms
Q-T Interval: 450 ms
QRS Duration: 106 ms
QTc Calculation (Bazett): 450 ms
R Axis: 49 degrees
T Axis: 17 degrees
Ventricular Rate: 60 {beats}/min

## 2022-10-30 ENCOUNTER — Encounter
Payer: BLUE CROSS/BLUE SHIELD | Attending: Rehabilitative and Restorative Service Providers" | Primary: Internal Medicine
